# Patient Record
Sex: Female | Born: 1970 | Race: White | Hispanic: Yes | Marital: Married | Smoking: Never smoker
Health system: Southern US, Community
[De-identification: ages and names within clinical notes are randomized; demographics above are authoritative.]

## PROBLEM LIST (undated history)

## (undated) DIAGNOSIS — Z86718 Personal history of other venous thrombosis and embolism: Secondary | ICD-10-CM

## (undated) DIAGNOSIS — D6851 Activated protein C resistance: Secondary | ICD-10-CM

## (undated) DIAGNOSIS — D649 Anemia, unspecified: Secondary | ICD-10-CM

## (undated) HISTORY — DX: Anemia, unspecified: D64.9

## (undated) HISTORY — PX: TUBAL LIGATION: SHX77

## (undated) HISTORY — DX: Personal history of other venous thrombosis and embolism: Z86.718

## (undated) HISTORY — DX: Activated protein C resistance: D68.51

---

## 2002-12-02 ENCOUNTER — Encounter (HOSPITAL_COMMUNITY): Admission: RE | Admit: 2002-12-02 | Discharge: 2003-01-01 | Payer: Self-pay | Admitting: Oncology

## 2002-12-02 ENCOUNTER — Encounter: Admission: RE | Admit: 2002-12-02 | Discharge: 2002-12-02 | Payer: Self-pay | Admitting: Oncology

## 2003-02-02 ENCOUNTER — Encounter: Admission: RE | Admit: 2003-02-02 | Discharge: 2003-02-02 | Payer: Self-pay | Admitting: Oncology

## 2003-02-28 ENCOUNTER — Ambulatory Visit (HOSPITAL_COMMUNITY): Admission: AD | Admit: 2003-02-28 | Discharge: 2003-02-28 | Payer: Self-pay | Admitting: Obstetrics & Gynecology

## 2003-03-31 ENCOUNTER — Encounter: Admission: RE | Admit: 2003-03-31 | Discharge: 2003-03-31 | Payer: Self-pay | Admitting: Oncology

## 2003-06-09 ENCOUNTER — Encounter: Admission: RE | Admit: 2003-06-09 | Discharge: 2003-06-09 | Payer: Self-pay | Admitting: Oncology

## 2003-06-09 ENCOUNTER — Encounter (HOSPITAL_COMMUNITY): Admission: RE | Admit: 2003-06-09 | Discharge: 2003-07-09 | Payer: Self-pay | Admitting: Oncology

## 2003-06-13 ENCOUNTER — Encounter: Payer: Self-pay | Admitting: *Deleted

## 2003-06-13 ENCOUNTER — Inpatient Hospital Stay (HOSPITAL_COMMUNITY): Admission: AD | Admit: 2003-06-13 | Discharge: 2003-06-26 | Payer: Self-pay | Admitting: *Deleted

## 2003-06-13 ENCOUNTER — Encounter (HOSPITAL_COMMUNITY): Payer: Self-pay | Admitting: Oncology

## 2003-06-15 ENCOUNTER — Encounter: Payer: Self-pay | Admitting: Obstetrics & Gynecology

## 2003-06-19 ENCOUNTER — Encounter: Payer: Self-pay | Admitting: Obstetrics & Gynecology

## 2003-06-22 ENCOUNTER — Encounter: Payer: Self-pay | Admitting: *Deleted

## 2003-07-17 ENCOUNTER — Encounter: Admission: RE | Admit: 2003-07-17 | Discharge: 2003-07-17 | Payer: Self-pay | Admitting: Oncology

## 2003-09-18 ENCOUNTER — Encounter: Admission: RE | Admit: 2003-09-18 | Discharge: 2003-09-18 | Payer: Self-pay | Admitting: Oncology

## 2006-10-08 ENCOUNTER — Observation Stay (HOSPITAL_COMMUNITY): Admission: AD | Admit: 2006-10-08 | Discharge: 2006-10-08 | Payer: Self-pay | Admitting: Obstetrics and Gynecology

## 2006-12-05 ENCOUNTER — Inpatient Hospital Stay (HOSPITAL_COMMUNITY): Admission: AD | Admit: 2006-12-05 | Discharge: 2006-12-07 | Payer: Self-pay | Admitting: Obstetrics and Gynecology

## 2007-01-11 ENCOUNTER — Ambulatory Visit (HOSPITAL_COMMUNITY): Admission: RE | Admit: 2007-01-11 | Discharge: 2007-01-11 | Payer: Self-pay | Admitting: Obstetrics and Gynecology

## 2009-03-20 ENCOUNTER — Other Ambulatory Visit: Admission: RE | Admit: 2009-03-20 | Discharge: 2009-03-20 | Payer: Self-pay | Admitting: Unknown Physician Specialty

## 2009-03-20 ENCOUNTER — Encounter (INDEPENDENT_AMBULATORY_CARE_PROVIDER_SITE_OTHER): Payer: Self-pay | Admitting: Unknown Physician Specialty

## 2011-04-11 NOTE — H&P (Signed)
[name], [name]            ACCOUNT NO.:  [id number]   MEDICAL RECORD NO.:  [id number]          PATIENT TYPE:  AMB   LOCATION:  DAY                           FACILITY:  [facility]   PHYSICIAN:  Tilda Burrow, M.D. DATE OF BIRTH:  [birth year]   DATE OF ADMISSION:  DATE OF DISCHARGE:  LH                              HISTORY & PHYSICAL   ADMISSION DIAGNOSES:  Desire for elective permanent sterilization.   HISTORY OF PRESENT ILLNESS:  This 40 year old Hispanic female now  recently status post uncomplicated vaginal delivery for breech infant is  admitted for elective permanent sterilization. She has been seen in our  office for postpartum visit, confirmed once again her desire for  permanent sterilization. She signed sterilization consents in the past.  She is admitted for elective permanent sterilization. Postpartum course  has been uneventful. It appears that records reviewed show that she had  a history of factor V Leiden in fact being diagnosed as a factor V  Leiden heterozygote after a pregnancy when she had pelvic vein  thrombosis after a third delivery and thrombophlebitis felt to be  attributable to the factor V Leiden. She received Lovenox for 6 weeks  postpartum.   PAST MEDICAL HISTORY:  Otherwise uneventful.   PAST SURGICAL HISTORY:  Negative.   ALLERGIES:  None.   Married. Works at [employer]   GENERAL EXAM:  Shows a healthy appearing Hispanic female. Weight 173.  Pupils are equal, round, and reactive. Extraocular movements intact.  NECK:  Supple. Trachea midline.  CHEST:  Clear to auscultation.  ABDOMEN:  Nontender.  EXTERNAL GENITALIA:  Normal.  VAGINAL EXAM:  Shows normal exam.  EXTREMITIES:  Within normal limits.   PLAN:  LTS Falope rings January 04, 2007.      Tilda Burrow, M.D.  Electronically Signed    JVF/MEDQ  D:  01/08/2007  T:  01/09/2007  Job:  [id number]

## 2011-04-11 NOTE — Discharge Summary (Signed)
[name], [name]                        ACCOUNT NO.:  [id number]   MEDICAL RECORD NO.:  [id number]                   PATIENT TYPE:  OBV   LOCATION:  LDR3                                 FACILITY:  [facility]   PHYSICIAN:  Lazaro Arms, M.D.                DATE OF BIRTH:  December 10, 1970   DATE OF ADMISSION:  06/13/2003  DATE OF DISCHARGE:  06/13/2003                                 DISCHARGE SUMMARY   HISTORY OF PRESENT ILLNESS:  [name] is a 40 year old Hispanic female,  gravida 4, para 3, abortus 0, with an estimated date of delivery of  July 29, 2003, by last menstrual period, six-week ultrasound, and  confirmatory 20-week ultrasound, currently at 33-3/[redacted] weeks gestation.  The  patient's prenatal course has been unremarkable to this point.  She  presented this morning to the office complaining of spontaneous rupture of  membranes at 5 a.m.  On examination, that was clearly the case.  The patient  denies any labor and in fact visually the cervix appears to be long, thick,  and closed.  The patient was brought to [facility] where she  underwent a group B Streptococcus culture.  CBC and UA were collected.  She  was given ampicillin.  I did an ultrasound which revealed the fetus to be in  the vertex presentation.  There was a very small amount of fluid surrounding  the baby.  The baby has a reactive NST with no variable decelerations.  I  called Dr. Gavin Potters and spoke with him.  She is being transferred for  expectant management at this time secondary to the need for a neonatal  intensive care unit.   Interestingly, the patient has a history with her last delivery in 2001 of a  septic pelvic thrombophlebitis after a normal spontaneous vaginal delivery.  She was found subsequently to have a factor V Leiden mutation and is  heterozygous for that.  She has been followed by Dr. Mariel Sleet periodically  again since that time, who is our hematologist/oncologist here on staff.  I  talked with him on a couple of occasions and he did not recommend any  antepartum treatment with subcu heparin, but did recommend postpartum  treatment with 40 mg of Lovenox subcu daily for six weeks.  Dr. Gavin Potters is  made aware of that his notes will be forwarded with the patient regarding  that.   PAST MEDICAL HISTORY:  Heterozygous for factor V Leiden mutation.   PAST SURGICAL HISTORY:  Negative.   PAST OBSTETRICAL HISTORY:  As stated above plus two normal spontaneous  vaginal deliveries.  All three were at term.  One was on [place], one was  in [place], and the last one was at [facility].   ALLERGIES:  None.   MEDICATIONS:  Prenatal vitamins.   SOCIAL HISTORY:  The patient is married.  She is [place].  She speaks very  little  English.   REVIEW OF SYSTEMS:  Otherwise negative.   LABORATORY DATA:  Blood type is A positive.  The antibody screen is  negative.  Rubella is immune.  Hepatitis B was negative.  HIV was  nonreactive.  Serology was nonreactive.  Pap was negative.  GC and chlamydia  were both  negative.  AFP was normal.  Glucola was normal.  A group B  Streptococcus has not yet been drawn in the office, but was collected today  after rupture of membranes.  The patient did have one Enterococcus UTI  during the pregnancy, was treated, and a followup culture was negative.   PHYSICAL EXAMINATION:  HEENT:  Unremarkable.  NECK:  The thyroid is normal.  LUNGS:  Clear.  BREASTS:  Deferred.  ABDOMEN:  Gravid.  Fundal height of 33 cm.  PELVIC:  The cervix is not assessed.  Visually the cervix is long, thick,  and closed.  EXTREMITIES:  Warm with no edema.  NEUROLOGIC:  Exam is grossly intact.   IMPRESSION:  1. Intrauterine pregnancy at 33-1/[redacted] weeks gestation.  2. Premature rupture of membranes.  3. Factor V Leiden mutation, heterozygote, with history of a postpartum     septic pelvic thrombophlebitis.   PLAN:  The patient is being transferred to [facility]  [place]  under the care of Dr. Gavin Potters for continued antepartum, intrapartum, and  post delivery care.  The patient and the father of the baby understand the  implication for this and agree to the transfer.                                               Lazaro Arms, M.D.    Loraine Maple  D:  06/13/2003  T:  06/13/2003  Job:  [id number]

## 2011-04-11 NOTE — H&P (Signed)
[name], [name]            ACCOUNT NO.:  [id number]   MEDICAL RECORD NO.:  [id number]          PATIENT TYPE:  INP   LOCATION:  A401                          FACILITY:  [facility]   PHYSICIAN:  Lazaro Arms, M.D.   DATE OF BIRTH:  02/01/1971   DATE OF ADMISSION:  12/05/2006  DATE OF DISCHARGE:  LH                              HISTORY & PHYSICAL   [name] is a 40 year old Hispanic female gravida 5, para 4, abortus 0  with estimated date of delivery of December 16, 2006, confirmed by her  last menstrual period and an 8 week ultrasound, currently at 38-1/[redacted]  weeks gestation, who presented to Labor & Delivery complaining of  regular uterine contractions.  She has been having contractions every 2-  3 minutes, she was 4 cm at time of admission but quickly progressed to  complete while I was doing another delivery.  Fetal heart rate tracing  was reactive and stable, there was no rupture of membranes.  I came in  and assessed her and the fetal part was floating, there was a bulging  bag of water filling up the entire vagina.  I did a gentle amniotomy and  got a large amount of very particulate meconium stained amniotic fluid.  As the fetal presentation part presenting came down it was found to be a  frank breech.  With the patient having delivered four babies before, one  being 9 pounds and 5 ounces, having a __________ pelvis, being frank  breech, I decided to proceed with delivery of a frank breech infant.   PAST MEDICAL HISTORY:  Significant for a postpartum hypogastric iliac  vein thrombosis after her third delivery with subsequent septic pelvic  thrombophlebitis and she is found to be heterozygous for Factor V  Leiden.  She is going to have prophylaxis with Lovenox for the 6 weeks  postpartum.  She has been seen by Dr. Mariel Sleet in the past.   PAST SURGICAL HISTORY:  Negative.   PAST OB:  Four vaginal deliveries.  The last one was at [facility] in 2004  because of premature labor and  rupture of membranes at 33 weeks.   MEDICATIONS:  Prenatal vitamins.   REVIEW OF SYSTEMS:  Otherwise negative.  Her blood type is A positive,  her antibody screen is negative, rubella is immune, hepatitis B is  negative, HIV is nonreactive.  HSV 1 and 2 are negative.  GC and  Chlamydia are negative x2.  AFP was normal.  Group B strep was negative.  Pap was normal.  Her Glucola I do not see recorded.   REVIEW OF SYSTEMS:  Is otherwise negative.   IMPRESSION:  1. Intrauterine pregnancy at 38-1/[redacted] weeks gestation.  2. Multiparous female in the frank breech presentation.   PLAN:  Preparations were made to do an assisted frank breech vaginal  delivery.      Lazaro Arms, M.D.  Electronically Signed     LHE/MEDQ  D:  12/05/2006  T:  12/06/2006  Job:  [id number]

## 2011-04-11 NOTE — Op Note (Signed)
[name], KUPER            ACCOUNT NO.:  [id number]   MEDICAL RECORD NO.:  [id number]          PATIENT TYPE:  INP   LOCATION:  A401                          FACILITY:  [facility]   PHYSICIAN:  Lazaro Arms, M.D.   DATE OF BIRTH:  1971-06-02   DATE OF PROCEDURE:  12/05/2006  DATE OF DISCHARGE:                               OPERATIVE REPORT   Modene is a 40 year old gravida 5, para 4, at 38-1/[redacted] weeks gestation who  presented to Labor & Delivery a short time ago and was found to be in  the frank breech presentation after amniotomy.  She was complete and the  buttocks was found to be at +2 station.  As a result, it was decided  with her history of having had 4 vaginal deliveries, with one being 9  pounds and 5 ounces, to go ahead and do a vaginal breech delivery; the  patient is very cooperative and pelvis is more than adequate.  The  patient pushed with three contractions in a row and we delivered sacrum  anterior.  The patient then stopped pushing on command, I did flexion of  the knees and delivered both lower extremities.  She then pushed to the  level of the shoulders.  There was two nuchal arms, both of which were  delivered by flexion, then extension at the elbow without difficulty.  There was also a nuchal cord x1.  The body was then supported and a  flexion of the fetal vertex was performed.  The patient pushed and with  one push delivered the fetal vertex.  The infant was delivered at 1524  with Apgars of 7 and 9.  The fluid around the head was clear.  The  infant underwent positive pressure O2 because of being a breech delivery  and quickly responded after about 10 seconds of positive pressure O2.  There was 3 vessel cord, cord blood and cord gas sent.  The infant then  underwent routine neonatal resuscitation.  The placenta was delivered  spontaneously and intact and the uterus was firm below the umbilicus.  There was a second degree midline laceration which was resulted  and  repaired with #3-0 Monocryl after adequate numbing.  There was good  repair of anatomy.  Blood loss for delivery was 250 mL and the mother  will undergo routine postpartum care with prophylactic Lovenox because  of her history of Factor V Leiden and history of a deep vein thrombosis.      Lazaro Arms, M.D.  Electronically Signed     LHE/MEDQ  D:  12/05/2006  T:  12/06/2006  Job:  [id number]

## 2011-04-11 NOTE — H&P (Signed)
NAMEMERIT, GADSBY            ACCOUNT NO.:  [id number]   MEDICAL RECORD NO.:  [id number]          PATIENT TYPE:  OIB   LOCATION:  LDR5                          FACILITY:  [facility]   PHYSICIAN:  Lazaro Arms, M.D.   DATE OF BIRTH:  [birth year]   DATE OF ADMISSION:  10/08/2006  DATE OF DISCHARGE:  LH                                HISTORY & PHYSICAL   Foye is approximately [redacted] weeks pregnant with her fifth baby.  She does  have a history of a preterm delivery due to PPROM at 33-1/2 weeks.  Our  communication was a little bit difficult due to the fact that she does not  speak [place] and I do not speak [place].  However, she get the point across  to me that she thought that she was having contractions since this morning.  Her cervix is fingertip dilated at the inner os and it is firm.  It is long  with an outer os about 2 cm dilated.  She was sent to labor and delivery for  evaluation of contractions and she is having contractions on the monitor  about every 4 minutes.  Fetal heart rate is reactive without decelerations.  At this point, we will treat her with subcutaneous terbutaline and IV fluids  and check a CBC.  Her urine did have +2 ketones and some blood in it so we  will hopefully get her just well hydrated and stop her contractions.      Jacklyn Shell, C.N.M.      Lazaro Arms, M.D.  Electronically Signed    FC/MEDQ  D:  10/08/2006  T:  10/08/2006  Job:  [id number]

## 2011-04-11 NOTE — Op Note (Signed)
NAMEKINGSTON, [name]            ACCOUNT NO.:  [id number]   MEDICAL RECORD NO.:  [id number]          PATIENT TYPE:  AMB   LOCATION:  DAY                           FACILITY:  [facility]   PHYSICIAN:  Tilda Burrow, M.D. DATE OF BIRTH:  1971/10/22   DATE OF PROCEDURE:  01/11/2007  DATE OF DISCHARGE:                               OPERATIVE REPORT   PREOP:  1. Elective sterilization.  2. Factor V Leiden carrier status.   POSTOP:  Elective sterilization.   PROCEDURE:  Laparoscopic tubal sterilization Falope ring.   SURGEON:  Ferguson.   ASSISTANT:  Whitt, CST.   ANESTHESIA:  General.   COMPLICATIONS:  None.   FINDINGS:  Healthy tubes bilaterally, no evidence of intraabdominal  adhesions, see photos taken.   DETAILS OF PROCEDURE:  The patient was taken to the operating room,  prepped and draped in the usual standard fashion with legs in low  lithotomy leg supports after general anesthesia was introduced without  difficulty.  The bladder was in-and-out catheterized and Hulka tenaculum  attached to the cervix for uterine  manipulation.  An infraumbilical,  vertical, 1-cm, skin incision was made as well as a transverse  suprapubic 1-cm incision.  A Veress needle was used to achieve  pneumoperitoneum through the umbilical incision while being careful to  orient the needle toward the pelvis while elevating the abdominal wall  by manual elevation.  Water droplet test was used to confirm  intraperitoneal placement.   Pneumoperitoneum was achieved easily under 8-10 mm of intra-abdominal  pressure; and the laparoscopic trocar was introduced, a 5-mm blunt  tipped trocar, under direct visualization using the video camera.  Peritoneal cavity was entered without difficulty.  Inspection of the  anterior surfaces of the abdominal contents showed no evidence of injury  or bleeding.  Attention was directed to the pelvis.  Findings were as  described above.   Attention was first directed to  the left fallopian tube which was  elevated, identified to its fimbriated end and grasped in its mid  portion with Falope ring applier.  Falope ring applied and then the tube  infiltrated with Marcaine solution 0.25% using a 22-gauge spinal needle  percutaneously applied.   Attention was then directed to the right fallopian tube where a similar  procedure was performed.  Photo documentation of the ring placements was  performed; 120 mL of saline was instilled into the abdomen; deflation of  CO2 performed; instruments removed and subcuticular 4-0 Dexon closure of  skin incisions performed.  The rest of the surgical instruments were  removed; Steri-Strips placed.  The patient allowed to awaken and go to  recovery room in standard fashion.      Tilda Burrow, M.D.  Electronically Signed     JVF/MEDQ  D:  01/11/2007  T:  01/11/2007  Job:  [id number]

## 2011-04-11 NOTE — Discharge Summary (Signed)
[name], [name]                        ACCOUNT NO.:  [id number]   MEDICAL RECORD NO.:  [id number]                   PATIENT TYPE:  INP   LOCATION:  9146                                 FACILITY:  [facility]   PHYSICIAN:  Maurice March, M.D.                  DATE OF BIRTH:  Jan 01, 1971   DATE OF ADMISSION:  06/13/2003  DATE OF DISCHARGE:  06/26/2003                                 DISCHARGE SUMMARY   ADMISSION DIAGNOSES:  50. A 40 year old gravidaida 4, para 3-0-0-3 at 33-3/7 weeks.  2. Preterm rupture of membranes.  3. Factor V Leiden disease.  4. History of pelvic septic thrombophlebitis with __________.   DISCHARGE DIAGNOSES:  64. A 40 year old gravidaida 4, para 4, postpartum day number three status post     normal spontaneous vaginal delivery.  2. Factor V Leiden disease.  3. Urticaria.   DISCHARGE MEDICATIONS:  1. Ibuprofen 600 mg p.o. q.6h. p.r.n.  2. Coumadin 5 mg p.o. every day.  3. Lovenox 40 mg injections b.i.d. until Coumadin level is therapeutic.  4. Micronor one p.o. every day as directed.  5. Benadryl 50 mg q.6h. p.r.n.  6. Prenatal vitamin one p.o. every day.   ADMISSION HISTORY:  [name] is a 40 year old G4 G4, P3-0-0-3 who  presented at 33-3/7 weeks with spontaneous ruptured membranes.  She was  noted to have intermittent contractions on admission.  The fetal heart  tracing was reassuring on admission.  She was admitted for preterm labor.   HOSPITAL COURSE:  On admission, the patient was given betamethasone x2.  She  was started on Unison as well as IV heparin for the history of pelvic septic  thrombophlebitis.  Throughout the hospital stay the patient was noted to  continue leaking fluid.  Her AFI dropped from 11 to 9.  Fetal lung maturity  was tested and noted to be equivocal.  On hospital day number ten the  patient went into spontaneous labor and delivered a viable boy with Apgars 9  and 9.  Prior to delivery the patient had hyperglycemia likely secondary  to  betamethasone.  She was put on NPH insulin for approximately ten days until  she went into labor.   Factor V Leiden disease with history of septic thrombophlebitis.  The  patient was placed on heparin until delivery when she was changed over to  Lovenox and Coumadin.  On the day of discharge her Coumadin level was sub-  therapeutic with an INR of 1.0.  home health was arranged for her to receive  Lovenox injections at home until her Coumadin level is therapeutic.  Once  her Coumadin level is therapeutic she will need Coumadin for six weeks.  She  is to return to [facility] each week for an INR check.  She will need to have  primary care arranged.   The patient is breast and bottle feeding.  For contraception she is not able  to have an estrogen containing birth control pill and was placed on  Micronor.  After breast feeding she should continue estrogen absent OCPs due  to the risk of clotting.   Urticaria.  The patient complained of urticaria on the day of discharge.  She did have some small urticarial lesions over the superior aspect of her  buttocks as well as her arms.  She is given Benadryl which eased her  pruritus.   CONDITION ON DISCHARGE:  The patient was discharged to home in stable  condition.   DISCHARGE INSTRUCTIONS:  Instructions given to the patient:  1. Patient was told of her medical regimen.  2. She was told to return to the MAU in one week for an INR.  3. She was told to return to [facility] in six weeks for a postpartum     followup visit.                                                 Maurice March, M.D.    LC/MEDQ  D:  06/27/2003  T:  06/28/2003  Job:  [id number]

## 2013-08-30 ENCOUNTER — Other Ambulatory Visit (HOSPITAL_COMMUNITY): Payer: Self-pay | Admitting: Physician Assistant

## 2013-08-30 DIAGNOSIS — Z139 Encounter for screening, unspecified: Secondary | ICD-10-CM

## 2013-09-19 ENCOUNTER — Ambulatory Visit (HOSPITAL_COMMUNITY): Payer: Self-pay

## 2013-10-24 ENCOUNTER — Ambulatory Visit (HOSPITAL_COMMUNITY)
Admission: RE | Admit: 2013-10-24 | Discharge: 2013-10-24 | Disposition: A | Discharge status: Home / Self Care | Payer: Self-pay | Source: Ambulatory Visit | Attending: Physician Assistant | Admitting: Physician Assistant

## 2013-10-24 DIAGNOSIS — Z139 Encounter for screening, unspecified: Secondary | ICD-10-CM

## 2013-11-24 DIAGNOSIS — D6851 Activated protein C resistance: Secondary | ICD-10-CM

## 2013-11-24 HISTORY — DX: Activated protein C resistance: D68.51

## 2014-05-03 ENCOUNTER — Other Ambulatory Visit (HOSPITAL_COMMUNITY): Payer: Self-pay | Admitting: Physician Assistant

## 2014-05-03 ENCOUNTER — Emergency Department (HOSPITAL_COMMUNITY)
Admission: EM | Admit: 2014-05-03 | Discharge: 2014-05-03 | Disposition: A | Discharge status: Home / Self Care | Payer: Self-pay | Attending: Emergency Medicine | Admitting: Emergency Medicine

## 2014-05-03 ENCOUNTER — Encounter (HOSPITAL_COMMUNITY): Payer: Self-pay | Admitting: Emergency Medicine

## 2014-05-03 ENCOUNTER — Ambulatory Visit (HOSPITAL_COMMUNITY)
Admission: RE | Admit: 2014-05-03 | Discharge: 2014-05-03 | Disposition: A | Discharge status: Home / Self Care | Payer: Self-pay | Source: Ambulatory Visit | Attending: Physician Assistant | Admitting: Physician Assistant

## 2014-05-03 DIAGNOSIS — I82409 Acute embolism and thrombosis of unspecified deep veins of unspecified lower extremity: Secondary | ICD-10-CM

## 2014-05-03 DIAGNOSIS — R609 Edema, unspecified: Secondary | ICD-10-CM

## 2014-05-03 DIAGNOSIS — R52 Pain, unspecified: Secondary | ICD-10-CM

## 2014-05-03 DIAGNOSIS — I824Y9 Acute embolism and thrombosis of unspecified deep veins of unspecified proximal lower extremity: Secondary | ICD-10-CM | POA: Insufficient documentation

## 2014-05-03 LAB — BASIC METABOLIC PANEL
BUN: 15 mg/dL (ref 6–23)
CO2: 25 mEq/L (ref 19–32)
Calcium: 9.2 mg/dL (ref 8.4–10.5)
Chloride: 103 mEq/L (ref 96–112)
Creatinine, Ser: 0.78 mg/dL (ref 0.50–1.10)
GFR calc Af Amer: >90 mL/min (ref >90)
Glucose, Bld: 91 mg/dL (ref 70–99)
POTASSIUM: 4.2 meq/L (ref 3.7–5.3)
SODIUM: 141 meq/L (ref 137–147)

## 2014-05-03 LAB — CBC WITH DIFFERENTIAL/PLATELET
BASOS PCT: 1 % (ref 0–1)
Basophils Absolute: 0 10*3/uL (ref 0.0–0.1)
Eosinophils Absolute: 0.2 10*3/uL (ref 0.0–0.7)
Eosinophils Relative: 3 % (ref 0–5)
HCT: 34.9 % — ABNORMAL LOW (ref 36.0–46.0)
Hemoglobin: 11.8 g/dL — ABNORMAL LOW (ref 12.0–15.0)
LYMPHS ABS: 2.6 10*3/uL (ref 0.7–4.0)
LYMPHS PCT: 37 % (ref 12–46)
MCH: 30.7 pg (ref 26.0–34.0)
MCHC: 33.8 g/dL (ref 30.0–36.0)
MCV: 90.9 fL (ref 78.0–100.0)
MONOS PCT: 6 % (ref 3–12)
Monocytes Absolute: 0.5 10*3/uL (ref 0.1–1.0)
NEUTROS ABS: 3.7 10*3/uL (ref 1.7–7.7)
NEUTROS PCT: 53 % (ref 43–77)
Platelets: 406 10*3/uL — ABNORMAL HIGH (ref 150–400)
RBC: 3.84 MIL/uL — AB (ref 3.87–5.11)
RDW: 13 % (ref 11.5–15.5)
WBC: 7.1 10*3/uL (ref 4.0–10.5)

## 2014-05-03 MED ORDER — XARELTO VTE STARTER PACK 15 & 20 MG PO TBPK: 15.0000 mg | ORAL_TABLET | ORAL | Status: DC

## 2014-05-03 NOTE — ED Notes (Signed)
Pt had US done today to left leg and resulted positive

## 2014-05-03 NOTE — Discharge Instructions (Signed)
Trombosis venosa profunda (Deep Vein Thrombosis) La trombosis venosa profunda (TVP) es la formacin de un cogulo de sangre en las venas profundas y ms grandes de la pierna, el brazo o la pelvis. Esta trombosis es ms peligrosa que los cogulos que se forman en las venas ubicadas cerca de la superficie del cuerpo. Puede causar complicaciones si el cogulo se desprende y viaja a travs de la Runner, broadcasting/film/videosangre hasta llegar a los pulmones.  La TVP puede daar las vlvulas de las venas de las piernas, de manera que el flujo sanguneo, Teacher, English as a foreign languageen lugar de ir hacia arriba, se acumula en la parte inferior de la pierna. A este trastorno se lo llama sndrome postrombtico, y Patent examinerpuede producir dolor, hinchazn, decoloracin y lceras en la pierna. CAUSAS Generalmente varios son los motivos que contribuyen a la formacin de cogulos de [place]angre. Entre los factores que favorecen la formacin de cogulos son:  El flujo sanguneo es lento.  Interior de la vena daado por algn motivo.  Tiene algn padecimiento que favorece la formacin de cogulos de [place]angre. FACTORES DE RIESGO Algunas personas tienen ms facilidad que otras para formar cogulos sanguneos. Los factores de riesgo son:   La edad avanzada, especialmente despus de los 75aos de edad.  Tener antecedentes familiares de cogulos de sangre o si ya ha tenido un cogulo de [place]re.  Que le hayan practicado una ciruga mayor o de duracin prolongada. Esto es an ms frecuente en cirugas de cadera, rodilla o estmago (abdomen). La ciruga de cadera es especialmente riesgosa.  Fractura de cadera o de pierna.  Permanecer sentado o acostado durante un tiempo prolongado. Aqu se incluyen un viaje de larga distancia, casos de parlisis o en recuperacin de una enfermedad o ciruga.  Cncer o tratamiento para el cncer.  Tener colocado un tubo fino y Equities traderlargo (catter) dentro de una vena durante un procedimiento mdico.  Wilburt Finlayener sobrepeso (ser obeso).  Embarazo y  [place].  Los cambios hormonales durante el embarazo facilitan la formacin de cogulos.  El feto hace presin sobre las venas de la pelvis.  Tambin hay riesgo de lesin de las venas durante el parto o la cesrea. El riesgo es mayor inmediatamente despus del [place] Parkparto.  Los medicamentos que contienen estrgenos. Estos incluyen las pldoras anticonceptivas y la terapia de reemplazo hormonal.  Fumar.  Otras afecciones circulatorias o cardacas.  SIGNOS Y SNTOMAS Cuando se forma un cogulo, puede obstruir en forma parcial o total el flujo sanguneo en la vena afectada. Los sntomas de TVP pueden ser:  Hinchazn de la pierna o del brazo, especialmente notorio de un solo lado.  Enrojecimiento y calor de la pierna o del brazo, especialmente de un slo lado.  Dolor en un brazo o en una pierna. Si el cogulo est ubicado en la pierna, los sntomas pueden ser ms notorios y Theme park managerempeorar al pararse o Advertising account plannercaminar. Los sntomas de TVP, cuando ha migrado a los pulmones, (embolia de [place] Rockpulmn, [place]) y Scientist, research (physical sciences)generalmente, comienzan sbitamente y pueden ser:  Harrel LemonFalta de aire.  Tos.  Toser y Social research officer, governmentescupir sangre o flema con Retail buyersangre.  Dolor en el pecho. Dolor que empeora al hacer inspiraciones profundas.  Latidos cardacos rpidos. Teresita Maduraoda persona con estos sntomas debe buscar rpidamente tratamiento mdico de Associate Professoremergencia. Comunquese con el servicio de emergencias de su localidad (911 en los [place]) si usted tiene estos sntomas. DIAGNSTICO Si se sospecha una TVP, su mdico le har una historia clnica completa y un examen fsico. Podrn solicitarle otros estudios, por ejemplo:  Anlisis de [place]burgsangre, incluyendo estudios  de coagulacin.  Ultrasonografa para evaluar si tiene cogulos en sus piernas o pulmones.  Estudios radiolgicos con inyeccin de contraste intravenoso para ver el flujo sanguneo (venografa).  Estudios de los pulmones, si tiene sntomas respiratorios. PREVENCIN  Ejercite las piernas con  regularidad. Camine media [employer].  Mantenga un peso apropiado para su altura.  Evite permanecer sentado o parado sin moverse durante perodos prolongados.  Las mujeres, especialmente las que tienen ms de 36aos, deben [employer] y beneficios de tomar medicamentos que contengan estrgeno, incluidas las pldoras anticonceptivas.  No fume, especialmente si toma estrgenos.  Los viajes de larga distancia pueden aumentar el riesgo de TVP. Ejercite las piernas caminando o moviendo los msculos una vez por hora.  Prevencin intrahospitalaria:  Muchos de los factores de riesgo anteriormente mencionados se refieren a situaciones que ocurren con la hospitalizacin, ya sea por enfermedad, lesin o Civil Service fast streamer.  Su mdico va a evaluar la necesidad de profilaxis del tromboembolismo venoso cuando se lo admita en el hospital. Si debe someterse a [place], su cirujano lo evaluar el mismo da o el da despus de la [place].  La prevencin puede ser con o sin medicamentos. TRATAMIENTO Si se diagnostica una TVP, debe ser tratada. Tambin en algunas circunstancias, puede prevenirse. Si ya ha tenido una TVP, aumenta el riesgo de volver a Teacher, music futuro . El tratamiento ms frecuente para la TVP se realiza con medicamentos anticoagulantes que reducen la tendencia de la sangre a coagularse. Los anticoagulantes pueden impedir la formacin de nuevos cogulos de sangre o que los ya existentes aumenten de Recruitment consultant. No pueden disolver los cogulos ya formados. El organismo lo hace por s mismo con Museum/gallery conservator. Los anticoagulantes se pueden administrar por va oral, por va intravenosa (IV) o inyectable. Su mdico determinar la mejor forma para usted. Estos son otros medicamentos o tratamiento que pueden utilizarse:  La heparina o los medicamentos relacionados (heparina de bajo peso molecular) son por lo general Financial risk analyst tratamiento para un cogulo de [place]. Actan rpidamente. Sin  embargo, no pueden tomarse por va oral.  La heparina disminuye la cantidad de un componente de la sangre cuya funcin es impedir las hemorragias y forma cogulos sanguneos (plaquetas). Ser controlado mediante anlisis de sangre para asegurarse de que esto no [place].  La warfarina es un anticoagulante que puede ingerirse. Demora unos [employer] a Scientist, water quality, por lo tanto se [place] en combinacin con heparina o medicamentos relacionados. Una vez que la warfarina comienza a actuar, la heparina se suspende.  Con menor frecuencia, se utilizan medicamentos que disuelven cogulos (trombolticos) para Restaurant manager, fast food una TVP. Como implican un mayor riesgo de [place], se utilizan principalmente en los casos ms graves, cuando la vida o la integridad fsica se ve Administrator.  En contadas ocasiones, un cogulo de sangre en la pierna debe eliminarse quirrgicamente.  Si usted no puede Solicitor, su mdico podra indicar la colocacin de un filtro en la vena principal del abdomen. Los filtros impiden que los cogulos lleguen a los pulmones. INSTRUCCIONES PARA EL CUIDADO EN EL HOGAR  Tome todos los medicamentos como le indic el mdico. Utilice los medicamentos de venta libre o recetados para Primary school teacher, el malestar o la fiebre, segn se lo indique el mdico.  La [place]. La [employer]son de las personas deber seguir tomando warfarina despus del alta hospitalaria. Su mdico le indicar la duracin del tratamiento (generalmente de 3 a y a Occupational psychologist toda la vida).  Las dosis  altas y bajas de warfarina son peligrosas. El exceso de warfarina aumenta el riesgo de [place]. Dosis demasiado bajas de warfarina aumentan el riesgo de formacin de cogulos. Durante el tratamiento con warfarina, es necesario que se realicen peridicamente anlisis de sangre que miden el tiempo de Biomedical scientist. Estos anlisis de sangre suelen incluir tanto el tiempo de protrombina (PT) como el ndice internacional  normalizado (INR). Los resultados del PT y del INR permiten al mdico ajustar la dosis de warfarina. La dosis puede cambiarse por varias razones. Es de suma importancia que tome la warfarina exactamente como se le prescribi, y que tenga los niveles de PT y de INR exactamente como se le indic.  Muchos de los alimentos, especialmente los que contienen vitamina K pueden interferir con la warfarina y Audiological scientist los [place] de PT y del INR. Los alimentos ricos en vitamina K son la espinaca, la col rizada, el brcoli, el repollo, la col y [address], repollitos de [place], guisantes, Counsellor, [employer] y [place], y tambin la carne de hgado de cerdo y [place], el t verde y el aceite de soja. Debe consumir siempre la misma cantidad de alimentos ricos en vitamina K. Evite los [employer] en su dieta, o informe a su mdico antes de cambiar su dieta. Solicite una cita con un nutricionista para hacerle las preguntas que le surjan.  Muchos medicamentos interfieren con la warfarina y [employer]Ex del PT y del INR. Debe informarle a su mdico acerca de todos los medicamentos que tome. Esto incluye todas las vitaminas y suplementos. Tenga especial cuidado con la aspirina y los medicamentos antiinflamatorios. Consulte a su mdico antes de tomarlos. No tome ni suspenda ningn medicamento tanto recetado como de venta libre, a menos que se lo haya indicado su mdico o Social research officer, government.  La warfarina puede tener efectos secundarios, principalmente aumento de hematomas o sangrado. Si se lastima, deber ejercer presin sobre las heridas por ms tiempo que lo habitual. Su mdico o su Administrator, sports comentarn otros posibles efectos secundarios.  El alcohol puede modificar la capacidad del organismo de metabolizar la warfarina. Es preferible evitar tomar bebidas alcohlicas o consumir solo muy pequeas cantidades cuando se est en tratamiento con warfarina. Hgale saber a su mdico si modifica su consumo de  alcohol.  Informe a su dentista o a otros profesionales antes de que le efecten cualquier procedimiento.  Actividad. Consulte con su mdico cundo podr volver a sus actividades normales. Es importante mantenerse activo para prevenir los cogulos sanguneos. Si est tomando un medicamento anticoagulante, evite los deportes de contacto.  Haga actividad fsica. Es Barrister's clerk. Esto es especialmente importante durante viajes, o al estar sentado o de pie durante largos perodos. Ejercite las piernas caminando o bombeando los msculos con frecuencia. Camine con frecuencia.  Medias de compresin. Son medias elsticas apretadas que aplican presin a las piernas. Esta presin ayuda a Programmer, systems en las piernas se coagule. Es posible que deba usar medias de compresin en su casa para prevenir una TVP.  No fume. Si fuma, abandone el hbito. Pdale ayuda al mdico, si es necesario.  Aprenda tanto como pueda sobre la TVP. Conocer ms sobre la enfermedad lo ayudar a Careers adviser las medidas necesarias para evitar que vuelva.  Utilice un brazalete o lleve consigo una tarjeta de alerta mdico. SOLICITE ATENCIN MDICA SI:  Nota que el ritmo de sus latidos cardacos est acelerado.  Se siente ms dbil o ms cansado que de costumbre.  Siente que va  a desmayarse.  Observa un aumento de los hematomas.  Siente que los sntomas no mejoran en el tiempo esperado.  Cree que tiene efectos secundarios por el medicamento. SOLICITE ATENCIN MDICA DE INMEDIATO SI:  Siente dolor en el pecho.  Tiene dificultad para respirar.  Aumenta la hinchazn o el dolor en una pierna.  Tose y escupe sangre.  Nota sangre en el vmito, en las heces o en la orina. ASEGRESE DE QUE:  Comprende estas instrucciones.  Controlar su afeccin.  Recibir ayuda de inmediato si no mejora o si empeora. Document Released: 02/17/2008 Document Revised: 08/31/2013 [facility] Patient Information 2014  [place], [place].

## 2014-05-03 NOTE — ED Provider Notes (Signed)
CSN: [id number]     Arrival date & time 05/03/14  1401 History   First MD Initiated Contact with Patient 05/03/14 1559     Chief Complaint  Patient presents with  . DVT     (Consider location/radiation/quality/duration/timing/severity/associated sxs/prior Treatment) Patient is a 43 y.o. female presenting with leg pain. The history is provided by the patient. No language interpreter was used.  Leg Pain Location:  Leg Leg location:  L leg Pain details:    Quality:  Aching   Severity:  Moderate   Progression:  Worsening Associated symptoms: no fever   Associated symptoms comment:  The patient came here for a lower extremity doppler ordered outpatient by her doctor to evaluation symptoms of left lower extremity pain and swelling. She reports history of DVT 14 years ago. No SOB, or chest pain.   History reviewed. No pertinent past medical history. History reviewed. No pertinent past surgical history. History reviewed. No pertinent family history. History  Substance Use Topics  . Smoking status: Never Smoker   . Smokeless tobacco: Not on file  . Alcohol Use: No   OB History   Grav Para Term Preterm Abortions TAB SAB Ect Mult Living                 Review of Systems  Constitutional: Negative for fever.  Respiratory: Negative for shortness of breath.   Cardiovascular: Negative for chest pain.  Musculoskeletal:       See HPI.  Neurological: Negative for numbness.      Allergies  Review of patient's allergies indicates no known allergies.  Home Medications   Prior to Admission medications   Not on File   BP 129/79  Pulse 80  Temp(Src) 98.2 F (36.8 C) (Oral)  Resp 20  SpO2 100%  LMP 04/24/2014 Physical Exam  Constitutional: She is oriented to person, place, and time. She appears well-developed and well-nourished. No distress.  Musculoskeletal:  Swelling to left foot without discoloration, redness or warmth. Non-tender foot. Calf and posterior thigh mildly  tender. No swelling of calf or thigh.  Neurological: She is alert and oriented to person, place, and time.  Skin: Skin is warm and dry.  Psychiatric: She has a normal mood and affect.    ED Course  Procedures (including critical care time) Labs Review Labs Reviewed  CBC WITH DIFFERENTIAL - Abnormal; Notable for the following:    RBC 3.84 (*)    Hemoglobin 11.8 (*)    HCT 34.9 (*)    Platelets 406 (*)    All other components within normal limits  BASIC METABOLIC PANEL    Imaging Review US Venous Img Lower Unilateral Left  05/03/2014   CLINICAL DATA:  Left leg pain and swelling  EXAM: Left LOWER EXTREMITY VENOUS DOPPLER ULTRASOUND  TECHNIQUE: Gray-scale sonography with graded compression, as well as color Doppler and duplex ultrasound were performed to evaluate the lower extremity deep venous systems from the level of the common femoral vein and including the common femoral, femoral, profunda femoral, popliteal and calf veins including the posterior tibial, peroneal and gastrocnemius veins when visible. The superficial great saphenous vein was also interrogated. Spectral Doppler was utilized to evaluate flow at rest and with distal augmentation maneuvers in the common femoral, femoral and popliteal veins.  COMPARISON:  None.  FINDINGS: Common Femoral Vein: Nonocclusive thrombus is noted  Saphenofemoral Junction: No evidence of thrombus. Normal compressibility and flow on color Doppler imaging.  Profunda Femoral Vein: No evidence of thrombus. Normal compressibility  and flow on color Doppler imaging.  Femoral Vein: Nonocclusive thrombus is noted centrally. Peripherally of the thrombus is more occlusive with decreased flow and lack of compressibility.  Popliteal Vein: No evidence of thrombus. Normal compressibility, respiratory phasicity and response to augmentation.  Calf Veins: Decreased flow is noted in the peroneal and anterior tibial vein suggestive of thrombus  Superficial Great Saphenous Vein:  No evidence of thrombus. Normal compressibility and flow on color Doppler imaging.  Venous Reflux:  None.  Other Findings:  None.  IMPRESSION: Multifocal deep venous thrombosis.   Electronically Signed   By: Alcide CleverMark  Lukens M.D.   On: 05/03/2014 13:43     EKG Interpretation None      MDM   Final diagnoses:  None    1. DVT, left LE  Doppler study done this afternoon reviewed and is positive for nonobstructive femoral vein thrombus and calf vein thrombi. No symptoms suggestive of PE. Xarelto provided through pharmacy on pharmaceutical company coupon/program. All aspects of treatment plan discussed with patient via interpreter.     Arnoldo HookerShari A Teauna Dubach, PA-C 05/03/14 1626

## 2014-05-03 NOTE — ED Provider Notes (Signed)
Patient seen/examined in the Emergency Department in conjunction with Midlevel Provider [place] Patient reports left Le pain.  She has DVT Exam : awake/alert, left LE edema noted.   Plan: will start on xarelto, phamacy and case management to see to assist with starting medications   Joya Gaskins, MD 05/03/14 1626

## 2014-05-03 NOTE — ED Provider Notes (Signed)
Medical screening examination/treatment/procedure(s) were conducted as a shared visit with non-physician practitioner(s) and myself.  I personally evaluated the patient during the encounter.   EKG Interpretation None        Joya Gaskins, MD 05/03/14 2156

## 2014-05-03 NOTE — Care Management Note (Signed)
Pt in ED with new DVT found on U/S today. Sent by [facility], due to pain in leg.  Spoke with patient and daughter in the room. Pt does not speak English well.  Is from [place] and speaks Spanish ,  But daughter is fluent in both, as she was born here. Pt does not have insurance. She was given the starter pack for Xeralto, along with Rx, and the application for the [employer] free medication program. She has an appointment at the [facility] tomorrow to follow- up after this visit and was instructed to fill out her part of the app., and take with her to the appointment for the clinic MD/ PA to finish and fax in for her medication for the next months up to 1 year.  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 Translator phone used to  Talk with pt and daughter  Assisting with explaining things she does not understand. Message left at free Clinic concerning pt visit tomorrow, and paper work given to be filled out.

## 2014-05-04 ENCOUNTER — Encounter (HOSPITAL_COMMUNITY): Payer: Self-pay | Admitting: Lab

## 2014-05-23 ENCOUNTER — Encounter (HOSPITAL_COMMUNITY): Payer: Self-pay

## 2014-05-23 ENCOUNTER — Encounter (HOSPITAL_COMMUNITY): Payer: Self-pay | Attending: Hematology and Oncology

## 2014-05-23 VITALS — BP 113/77 | HR 65 | Temp 98.2°F | Resp 16 | Wt 178.2 lb

## 2014-05-23 DIAGNOSIS — I82412 Acute embolism and thrombosis of left femoral vein: Secondary | ICD-10-CM

## 2014-05-23 DIAGNOSIS — Z86718 Personal history of other venous thrombosis and embolism: Secondary | ICD-10-CM | POA: Insufficient documentation

## 2014-05-23 DIAGNOSIS — I82819 Embolism and thrombosis of superficial veins of unspecified lower extremities: Secondary | ICD-10-CM

## 2014-05-23 DIAGNOSIS — Z7901 Long term (current) use of anticoagulants: Secondary | ICD-10-CM | POA: Insufficient documentation

## 2014-05-23 DIAGNOSIS — D649 Anemia, unspecified: Secondary | ICD-10-CM | POA: Insufficient documentation

## 2014-05-23 DIAGNOSIS — I824Y9 Acute embolism and thrombosis of unspecified deep veins of unspecified proximal lower extremity: Secondary | ICD-10-CM | POA: Insufficient documentation

## 2014-05-23 LAB — CBC WITH DIFFERENTIAL/PLATELET
Basophils Absolute: 0 10*3/uL (ref 0.0–0.1)
Basophils Relative: 0 % (ref 0–1)
Eosinophils Absolute: 0.3 10*3/uL (ref 0.0–0.7)
Eosinophils Relative: 5 % (ref 0–5)
HEMATOCRIT: 35.1 % — AB (ref 36.0–46.0)
HEMOGLOBIN: 11.8 g/dL — AB (ref 12.0–15.0)
LYMPHS ABS: 2.2 10*3/uL (ref 0.7–4.0)
LYMPHS PCT: 40 % (ref 12–46)
MCH: 30.5 pg (ref 26.0–34.0)
MCHC: 33.6 g/dL (ref 30.0–36.0)
MCV: 90.7 fL (ref 78.0–100.0)
MONO ABS: 0.4 10*3/uL (ref 0.1–1.0)
MONOS PCT: 7 % (ref 3–12)
NEUTROS ABS: 2.6 10*3/uL (ref 1.7–7.7)
NEUTROS PCT: 48 % (ref 43–77)
Platelets: 307 10*3/uL (ref 150–400)
RBC: 3.87 MIL/uL (ref 3.87–5.11)
RDW: 13.3 % (ref 11.5–15.5)
WBC: 5.5 10*3/uL (ref 4.0–10.5)

## 2014-05-23 LAB — COMPREHENSIVE METABOLIC PANEL
ALK PHOS: 53 U/L (ref 39–117)
ALT: 16 U/L (ref 0–35)
AST: 18 U/L (ref 0–37)
Albumin: 3.8 g/dL (ref 3.5–5.2)
BUN: 17 mg/dL (ref 6–23)
CO2: 24 mEq/L (ref 19–32)
Calcium: 9.4 mg/dL (ref 8.4–10.5)
Chloride: 103 mEq/L (ref 96–112)
Creatinine, Ser: 0.81 mg/dL (ref 0.50–1.10)
GFR calc Af Amer: >90 mL/min (ref >90)
GFR, EST NON AFRICAN AMERICAN: 89 mL/min — AB (ref >90)
GLUCOSE: 94 mg/dL (ref 70–99)
POTASSIUM: 3.8 meq/L (ref 3.7–5.3)
SODIUM: 140 meq/L (ref 137–147)
Total Bilirubin: 0.2 mg/dL — ABNORMAL LOW (ref 0.3–1.2)
Total Protein: 7.6 g/dL (ref 6.0–8.3)

## 2014-05-23 LAB — LACTATE DEHYDROGENASE: LDH: 204 U/L (ref 94–250)

## 2014-05-23 LAB — D-DIMER, QUANTITATIVE: D-Dimer, Quant: 0.57 ug/mL-FEU — ABNORMAL HIGH (ref 0.00–0.48)

## 2014-05-23 MED ORDER — HYDROCODONE-ACETAMINOPHEN 5-325 MG PO TABS: 1.0000 | ORAL_TABLET | Freq: Four times a day (QID) | ORAL | Status: DC | PRN

## 2014-05-23 NOTE — Progress Notes (Signed)
[place] A. Barnet Glasgow, M.D.  NEW PATIENT EVALUATION   Name: Clemie General Date: 05/23/2014 MRN: [id number] DOB: 06-04-1971  PCP: Jacqualine Mau, PA-C   REFERRING PHYSICIAN: Jacqualine Mau, PA-C  REASON FOR REFERRAL: Left femoral near occlusive deep venous thrombosis.     HISTORY OF PRESENT ILLNESS:[name] is a 43 y.o. female who is referred for evaluation of possible thrombophilia with left lower extremity nearly occlusive femoral vein thrombosis with previous thrombosis 14 years ago. She is gravida 5 para 5 AB 0 with her menstrual period beginning today and lasting usually 4 days. She still has pain in the left mid thigh region without significant swelling. She denies any chest pain, PND, orthopnea, or palpitations. Previous blood clot within her pelvis and she recalls taking blood thinners for about 2-3 months only. She lives at home and cares for children. She denies any headache, melena, hematochezia, epistaxis, hematuria, skin rash, joint pain, headache, or seizures.   PAST MEDICAL HISTORY:  has a past medical history of History of blood clots.     PAST SURGICAL HISTORY:History reviewed. No pertinent past surgical history.   CURRENT MEDICATIONS: has a current medication list which includes the following prescription(s): acetaminophen, hydrocodone-acetaminophen, and xarelto starter pack.   ALLERGIES: Review of patient's allergies indicates no known allergies.   SOCIAL HISTORY:  reports that she has never smoked. She does not have any smokeless tobacco history on file. She reports that she does not drink alcohol or use illicit drugs.   FAMILY HISTORY: family history is not on file.    REVIEW OF SYSTEMS:  Other than that discussed above is noncontributory.    PHYSICAL EXAM:  weight is 178 lb 3.2 oz (80.831 kg). Her temperature is 98.2 F (36.8 C). Her blood pressure is 113/77 and her pulse is 65. Her  respiration is 16.    GENERAL:alert, no distress and comfortable SKIN: skin color, texture, turgor are normal, no rashes or significant lesions EYES: normal, Conjunctiva are pink and non-injected, sclera clear OROPHARYNX:no exudate, no erythema and lips, buccal mucosa, and tongue normal  NECK: supple, thyroid normal size, non-tender, without nodularity CHEST: Normal AP diameter with no breast masses. LYMPH:  no palpable lymphadenopathy in the cervical, axillary or inguinal LUNGS: clear to auscultation and percussion with normal breathing effort HEART: regular rate & rhythm and no murmurs. P2 is tolerable at the apex. ABDOMEN:abdomen soft, non-tender and normal bowel sounds MUSCULOSKELETALl:no cyanosis of digits, no clubbing or edema. Minimal tenderness in the mid thigh region with no significant swelling. Homans sign is negative.  NEURO: alert & oriented x 3 with fluent speech, no focal motor/sensory deficits    LABORATORY DATA:  Admission on 05/03/2014, Discharged on 05/03/2014  Component Date Value Ref Range Status  . Sodium 05/03/2014 141  137 - 147 mEq/L Final  . Potassium 05/03/2014 4.2  3.7 - 5.3 mEq/L Final  . Chloride 05/03/2014 103  96 - 112 mEq/L Final  . CO2 05/03/2014 25  19 - 32 mEq/L Final  . Glucose, Bld 05/03/2014 91  70 - 99 mg/dL Final  . BUN 05/03/2014 15  6 - 23 mg/dL Final  . Creatinine, Ser 05/03/2014 0.78  0.50 - 1.10 mg/dL Final  . Calcium 05/03/2014 9.2  8.4 - 10.5 mg/dL Final  . GFR calc non Af Amer 05/03/2014 >90  >90 mL/min Final  . GFR calc Af Amer 05/03/2014 >90  >90 mL/min Final  Comment: (NOTE)                          The eGFR has been calculated using the CKD EPI equation.                          This calculation has not been validated in all clinical situations.                          eGFR's persistently <90 mL/min signify possible Chronic Kidney                          Disease.  . WBC 05/03/2014 7.1  4.0 - 10.5 K/uL Final  . RBC 05/03/2014  3.84* 3.87 - 5.11 MIL/uL Final  . Hemoglobin 05/03/2014 11.8* 12.0 - 15.0 g/dL Final  . HCT 05/03/2014 34.9* 36.0 - 46.0 % Final  . MCV 05/03/2014 90.9  78.0 - 100.0 fL Final  . MCH 05/03/2014 30.7  26.0 - 34.0 pg Final  . MCHC 05/03/2014 33.8  30.0 - 36.0 g/dL Final  . RDW 05/03/2014 13.0  11.5 - 15.5 % Final  . Platelets 05/03/2014 406* 150 - 400 K/uL Final  . Neutrophils Relative % 05/03/2014 53  43 - 77 % Final  . Neutro Abs 05/03/2014 3.7  1.7 - 7.7 K/uL Final  . Lymphocytes Relative 05/03/2014 37  12 - 46 % Final  . Lymphs Abs 05/03/2014 2.6  0.7 - 4.0 K/uL Final  . Monocytes Relative 05/03/2014 6  3 - 12 % Final  . Monocytes Absolute 05/03/2014 0.5  0.1 - 1.0 K/uL Final  . Eosinophils Relative 05/03/2014 3  0 - 5 % Final  . Eosinophils Absolute 05/03/2014 0.2  0.0 - 0.7 K/uL Final  . Basophils Relative 05/03/2014 1  0 - 1 % Final  . Basophils Absolute 05/03/2014 0.0  0.0 - 0.1 K/uL Final    Urinalysis No results found for this basename: colorurine,  appearanceur,  labspec,  phurine,  glucoseu,  hgbur,  bilirubinur,  ketonesur,  proteinur,  urobilinogen,  nitrite,  leukocytesur      _0 : US Venous Img Lower Unilateral Left  05/03/2014   CLINICAL DATA:  Left leg pain and swelling  EXAM: Left LOWER EXTREMITY VENOUS DOPPLER ULTRASOUND  TECHNIQUE: Gray-scale sonography with graded compression, as well as color Doppler and duplex ultrasound were performed to evaluate the lower extremity deep venous systems from the level of the common femoral vein and including the common femoral, femoral, profunda femoral, popliteal and calf veins including the posterior tibial, peroneal and gastrocnemius veins when visible. The superficial great saphenous vein was also interrogated. Spectral Doppler was utilized to evaluate flow at rest and with distal augmentation maneuvers in the common femoral, femoral and popliteal veins.  COMPARISON:  None.  FINDINGS: Common Femoral Vein: Nonocclusive  thrombus is noted  Saphenofemoral Junction: No evidence of thrombus. Normal compressibility and flow on color Doppler imaging.  Profunda Femoral Vein: No evidence of thrombus. Normal compressibility and flow on color Doppler imaging.  Femoral Vein: Nonocclusive thrombus is noted centrally. Peripherally of the thrombus is more occlusive with decreased flow and lack of compressibility.  Popliteal Vein: No evidence of thrombus. Normal compressibility, respiratory phasicity and response to augmentation.  Calf Veins: Decreased flow is noted in the peroneal and anterior tibial vein suggestive of thrombus  Superficial Doristine Devoid  Saphenous Vein: No evidence of thrombus. Normal compressibility and flow on color Doppler imaging.  Venous Reflux:  None.  Other Findings:  None.  IMPRESSION: Multifocal deep venous thrombosis.   Electronically Signed   By: Inez Catalina M.D.   On: 05/03/2014 13:43    PATHOLOGY: No new pathology.   IMPRESSION:  #1. Multifocal deep venous thromboses left lower extremity. #2. History of previous pelvic thrombosis.   PLAN:  #1. Continue Xarelto 20 mg daily for at least 6 months. #2. Hydrocodone 5/APAP 325 every 6 hours as needed for pain. #3. Thrombophilia workup ordered. #4. Followup in 4 weeks with CBC and d-dimer.  I appreciate the opportunity of sharing in her care.   Doroteo Bradford, MD 05/23/2014 3:52 PM   DISCLAIMER:  This note was dictated with voice recognition softwre.  Similar sounding words can inadvertently be transcribed inaccurately and may not be corrected upon review.

## 2014-05-23 NOTE — Progress Notes (Signed)
[name] presented for labwork. Labs per MD order drawn via Peripheral Line 25 gauge needle inserted in rt ac.  Good blood return present. Procedure without incident.  Needle removed intact. Patient tolerated procedure well.

## 2014-05-23 NOTE — Patient Instructions (Signed)
..  [facility] Discharge Instructions  RECOMMENDATIONS MADE BY THE CONSULTANT AND ANY TEST RESULTS WILL BE SENT TO YOUR REFERRING PHYSICIAN.  EXAM FINDINGS BY THE PHYSICIAN TODAY AND SIGNS OR SYMPTOMS TO REPORT TO CLINIC OR PRIMARY PHYSICIAN: Exam and findings as discussed by Dr. Zigmund DanielFormanek.  MEDICATIONS PRESCRIBED:  Xarelto  INSTRUCTIONS/FOLLOW-UP: 4 weeks to see MD and lab work  Thank you for choosing [facility] to provide your oncology and hematology care.  To afford each patient quality time with our providers, please arrive at least 15 minutes before your scheduled appointment time.  With your help, our goal is to use those 15 minutes to complete the necessary work-up to ensure our physicians have the information they need to help with your evaluation and healthcare recommendations.    Effective January 1st, 2014, we ask that you re-schedule your appointment with our physicians should you arrive 10 or more minutes late for your appointment.  We strive to give you quality time with our providers, and arriving late affects you and other patients whose appointments are after yours.    Again, thank you for choosing [facility].  Our hope is that these requests will decrease the amount of time that you wait before being seen by our physicians.       _____________________________________________________________  Should you have questions after your visit to [facility], please contact our office at [phone] between the hours of 8:30 a.m. and 4:30 p.m.  Voicemails left after 4:30 p.m. will not be returned until the following business day.  For prescription refill requests, have your pharmacy contact our office with your prescription refill request.    _______________________________________________________________  We hope that we have given you very good care.  You may receive a patient satisfaction survey in the mail, please  complete it and return it as soon as possible.  We value your feedback!  _______________________________________________________________  Have you asked about our STAR program?  STAR stands for Survivorship Training and Rehabilitation, and this is a nationally recognized cancer care program that focuses on survivorship and rehabilitation.  Cancer and cancer treatments may cause problems, such as, pain, making you feel tired and keeping you from doing the things that you need or want to do. Cancer rehabilitation can help. Our goal is to reduce these troubling effects and help you have the best quality of life possible.  You may receive a survey from a nurse that asks questions about your current state of health.  Based on the survey results, all eligible patients will be referred to the [facility] for an evaluation so we can better serve you!  A frequently asked questions sheet is available upon request.

## 2014-05-24 LAB — LUPUS ANTICOAGULANT PANEL
DRVVT CONFIRMATION: 1.04 ratio (ref <1.15)
DRVVT: 57.8 secs — ABNORMAL HIGH (ref <42.9)
Lupus Anticoagulant: NOT DETECTED
PTT Lupus Anticoagulant: 41.2 secs (ref 28.0–43.0)
dRVVT Incubated 1:1 Mix: 46.2 secs — ABNORMAL HIGH (ref <42.9)

## 2014-05-24 LAB — BETA-2-GLYCOPROTEIN I ABS, IGG/M/A
Beta-2 Glyco I IgG: 0 G Units (ref <20)
Beta-2-Glycoprotein I IgA: 9 A Units (ref <20)
Beta-2-Glycoprotein I IgM: 6 M Units (ref <20)

## 2014-05-24 LAB — ANTITHROMBIN III: AntiThromb III Func: 100 % (ref 75–120)

## 2014-05-24 LAB — HOMOCYSTEINE: Homocysteine: 16.7 umol/L — ABNORMAL HIGH (ref 4.0–15.4)

## 2014-05-24 LAB — CARDIOLIPIN ANTIBODIES, IGG, IGM, IGA
Anticardiolipin IgA: 5 APL U/mL — ABNORMAL LOW (ref <22)
Anticardiolipin IgG: 8 GPL U/mL — ABNORMAL LOW (ref <23)
Anticardiolipin IgM: 11 MPL U/mL — ABNORMAL HIGH (ref <11)

## 2014-05-24 LAB — FERRITIN: FERRITIN: 85 ng/mL (ref 10–291)

## 2014-05-24 LAB — PROTEIN C ACTIVITY: Protein C Activity: 172 % — ABNORMAL HIGH (ref 75–133)

## 2014-05-24 LAB — PROTEIN S, TOTAL: Protein S Ag, Total: 80 % (ref 60–150)

## 2014-05-24 LAB — PROTEIN S ACTIVITY: Protein S Activity: 113 % (ref 69–129)

## 2014-05-24 LAB — PROTEIN C, TOTAL: Protein C, Total: 92 % (ref 72–160)

## 2014-05-29 ENCOUNTER — Other Ambulatory Visit (HOSPITAL_COMMUNITY): Payer: Self-pay | Admitting: Hematology and Oncology

## 2014-05-29 ENCOUNTER — Telehealth (HOSPITAL_COMMUNITY): Payer: Self-pay

## 2014-05-29 LAB — FACTOR 5 LEIDEN

## 2014-05-29 LAB — PROTHROMBIN GENE MUTATION

## 2014-05-29 MED ORDER — FOLIC ACID 1 MG PO TABS: 1.0000 mg | ORAL_TABLET | Freq: Every day | ORAL | Status: DC

## 2014-05-29 NOTE — Telephone Encounter (Signed)
Message left for patient regarding Folic Acid.  Call back confirmation requested.

## 2014-05-29 NOTE — Telephone Encounter (Signed)
Message copied by Evelena LeydenBURGESS, Harlyn Rathmann S on Mon May 29, 2014 12:14 PM ------      Message from: Alla GermanFORMANEK, GREGORY A      Created: Mon May 29, 2014  8:09 AM       Have ordered Folic Acid 1mg  daily from her pharmacy because of increased Homocysteine level.  Please notify her.  Thanks. Dr.F ------

## 2014-05-30 ENCOUNTER — Encounter (HOSPITAL_COMMUNITY): Payer: Self-pay

## 2014-05-30 NOTE — Telephone Encounter (Signed)
Letter sent notifying patient of prescription.

## 2014-06-03 DIAGNOSIS — D6851 Activated protein C resistance: Secondary | ICD-10-CM | POA: Insufficient documentation

## 2014-06-12 ENCOUNTER — Other Ambulatory Visit (HOSPITAL_COMMUNITY): Payer: Self-pay | Admitting: Oncology

## 2014-06-12 DIAGNOSIS — D6851 Activated protein C resistance: Secondary | ICD-10-CM

## 2014-06-12 MED ORDER — RIVAROXABAN 20 MG PO TABS
20.0000 mg | ORAL_TABLET | Freq: Every day | ORAL | Status: DC
Start: 2014-06-12 — End: 2014-09-18

## 2014-06-22 ENCOUNTER — Encounter (HOSPITAL_COMMUNITY): Payer: Self-pay

## 2014-06-22 ENCOUNTER — Encounter (HOSPITAL_COMMUNITY): Payer: Self-pay | Attending: Hematology and Oncology

## 2014-06-22 ENCOUNTER — Encounter (HOSPITAL_BASED_OUTPATIENT_CLINIC_OR_DEPARTMENT_OTHER): Payer: Self-pay

## 2014-06-22 VITALS — BP 110/52 | HR 65 | Temp 99.0°F | Resp 18 | Wt 176.6 lb

## 2014-06-22 DIAGNOSIS — D6851 Activated protein C resistance: Secondary | ICD-10-CM

## 2014-06-22 DIAGNOSIS — D6859 Other primary thrombophilia: Secondary | ICD-10-CM | POA: Insufficient documentation

## 2014-06-22 DIAGNOSIS — E7211 Homocystinuria: Secondary | ICD-10-CM

## 2014-06-22 DIAGNOSIS — I82819 Embolism and thrombosis of superficial veins of unspecified lower extremities: Secondary | ICD-10-CM | POA: Insufficient documentation

## 2014-06-22 DIAGNOSIS — I82412 Acute embolism and thrombosis of left femoral vein: Secondary | ICD-10-CM

## 2014-06-22 DIAGNOSIS — E7210 Disorders of sulfur-bearing amino-acid metabolism, unspecified: Secondary | ICD-10-CM | POA: Insufficient documentation

## 2014-06-22 LAB — CBC WITH DIFFERENTIAL/PLATELET
Basophils Absolute: 0 10*3/uL (ref 0.0–0.1)
Basophils Relative: 0 % (ref 0–1)
Eosinophils Absolute: 0.1 10*3/uL (ref 0.0–0.7)
Eosinophils Relative: 2 % (ref 0–5)
HCT: 36.8 % (ref 36.0–46.0)
HEMOGLOBIN: 12.5 g/dL (ref 12.0–15.0)
LYMPHS ABS: 2.4 10*3/uL (ref 0.7–4.0)
Lymphocytes Relative: 38 % (ref 12–46)
MCH: 30.9 pg (ref 26.0–34.0)
MCHC: 34 g/dL (ref 30.0–36.0)
MCV: 90.9 fL (ref 78.0–100.0)
MONOS PCT: 7 % (ref 3–12)
Monocytes Absolute: 0.4 10*3/uL (ref 0.1–1.0)
NEUTROS ABS: 3.3 10*3/uL (ref 1.7–7.7)
NEUTROS PCT: 53 % (ref 43–77)
Platelets: 353 10*3/uL (ref 150–400)
RBC: 4.05 MIL/uL (ref 3.87–5.11)
RDW: 13.1 % (ref 11.5–15.5)
WBC: 6.2 10*3/uL (ref 4.0–10.5)

## 2014-06-22 LAB — D-DIMER, QUANTITATIVE: D-Dimer, Quant: 0.39 ug/mL-FEU (ref 0.00–0.48)

## 2014-06-22 NOTE — Progress Notes (Signed)
LABS DRAWN FOR HOMOC,CBCD,DDIM.

## 2014-06-22 NOTE — Patient Instructions (Signed)
..  [facility] Discharge Instructions  RECOMMENDATIONS MADE BY THE CONSULTANT AND ANY TEST RESULTS WILL BE SENT TO YOUR REFERRING PHYSICIAN.  EXAM FINDINGS BY THE PHYSICIAN TODAY AND SIGNS OR SYMPTOMS TO REPORT TO CLINIC OR PRIMARY PHYSICIAN: Exam and findings as discussed by Dr. Zigmund DanielFormanek.  MEDICATIONS PRESCRIBED:  Continue xarelto/ anticoagulant therapy lifelong Your children should talk to their doctor and be checked for Factor V leiden clotting disorder Any female offspring who test positive should not take birth control pills  INSTRUCTIONS/FOLLOW-UP: 3 months with labs and MD visit  Thank you for choosing [facility] to provide your oncology and hematology care.  To afford each patient quality time with our providers, please arrive at least 15 minutes before your scheduled appointment time.  With your help, our goal is to use those 15 minutes to complete the necessary work-up to ensure our physicians have the information they need to help with your evaluation and healthcare recommendations.    Effective January 1st, 2014, we ask that you re-schedule your appointment with our physicians should you arrive 10 or more minutes late for your appointment.  We strive to give you quality time with our providers, and arriving late affects you and other patients whose appointments are after yours.    Again, thank you for choosing [facility].  Our hope is that these requests will decrease the amount of time that you wait before being seen by our physicians.       _____________________________________________________________  Should you have questions after your visit to [facility], please contact our office at [phone] between the hours of 8:30 a.m. and 4:30 p.m.  Voicemails left after 4:30 p.m. will not be returned until the following business day.  For prescription refill requests, have your pharmacy contact our office with your  prescription refill request.    _______________________________________________________________  We hope that we have given you very good care.  You may receive a patient satisfaction survey in the mail, please complete it and return it as soon as possible.  We value your feedback!  _______________________________________________________________  Have you asked about our STAR program?  STAR stands for Survivorship Training and Rehabilitation, and this is a nationally recognized cancer care program that focuses on survivorship and rehabilitation.  Cancer and cancer treatments may cause problems, such as, pain, making you feel tired and keeping you from doing the things that you need or want to do. Cancer rehabilitation can help. Our goal is to reduce these troubling effects and help you have the best quality of life possible.  You may receive a survey from a nurse that asks questions about your current state of health.  Based on the survey results, all eligible patients will be referred to the [facility] for an evaluation so we can better serve you!  A frequently asked questions sheet is available upon request.

## 2014-06-22 NOTE — Progress Notes (Signed)
[facility]  OFFICE PROGRESS NOTE  Alda LeaMcElroy, Tonya G, PA-C [facility] [address] [place] [id number]  DIAGNOSIS: Factor 5 Leiden mutation, heterozygous - Plan: CBC with Differential, D-dimer, quantitative  Thrombosis of left femoral vein - Plan: CBC with Differential, D-dimer, quantitative  Homocystinemia - Plan: Homocysteine, serum  Chief Complaint  Patient presents with  . Follow-up  . Thrombophilia    CURRENT THERAPY: Xarelto 20 mg daily. Folic acid 1 mg daily  INTERVAL HISTORY: Tonya EmmsDilcia Roberts 43 y.o. female returns for followup for left lower extremity nearly occlusive femoral vein thrombosis with previous thrombosis 14 years ago having undergone workup for thrombophilia with heterozygous factor V Leiden mutation determine along with hyper homocystinemia.  She continues on folic acid and Xarelto. She denies any epistaxis, melena, hematochezia, hematuria, or hemoptysis. She did experience her menstrual period from July 5 lasting 5 days with heavier bleeding than usual. Left lower extremity is still somewhat achy but without swelling or redness. She denies any chest pain, PND, orthopnea, palpitations.  MEDICAL HISTORY: Past Medical History  Diagnosis Date  . History of blood clots     1st blood clot was 4921yrs ago  . Factor 5 Leiden mutation, heterozygous 2015  . Anemia     INTERIM HISTORY: has Anemia, normocytic normochromic and Factor 5 Leiden mutation, heterozygous on her problem list.    ALLERGIES:  has No Known Allergies.  MEDICATIONS: has a current medication list which includes the following prescription(s): acetaminophen, folic acid, rivaroxaban, and hydrocodone-acetaminophen.  SURGICAL HISTORY: History reviewed. No pertinent past surgical history.  FAMILY HISTORY: family history is not on file.  SOCIAL HISTORY:  reports that she has never smoked. She does not have any smokeless tobacco  history on file. She reports that she does not drink alcohol or use illicit drugs.  REVIEW OF SYSTEMS:  Other than that discussed above is noncontributory.  PHYSICAL EXAMINATION: ECOG PERFORMANCE STATUS: 1 - Symptomatic but completely ambulatory  Blood pressure 110/52, pulse 65, temperature 99 F (37.2 C), temperature source Oral, resp. rate 18, weight 176 lb 9.6 oz (80.105 kg).  GENERAL:alert, no distress and comfortable SKIN: skin color, texture, turgor are normal, no rashes or significant lesions EYES: PERLA; Conjunctiva are pink and non-injected, sclera clear SINUSES: No redness or tenderness over maxillary or ethmoid sinuses OROPHARYNX:no exudate, no erythema on lips, buccal mucosa, or tongue. NECK: supple, thyroid normal size, non-tender, without nodularity. No masses CHEST: Normal AP diameter with no breast masses. LYMPH:  no palpable lymphadenopathy in the cervical, axillary or inguinal LUNGS: clear to auscultation and percussion with normal breathing effort HEART: regular rate & rhythm and no murmurs. P2 is tolerable at the apex. ABDOMEN:abdomen soft, non-tender and normal bowel sounds MUSCULOSKELETAL:no cyanosis of digits and no clubbing. Range of motion normal. Left lower extremity without swelling or redness. Homans sign negative. NEURO: alert & oriented x 3 with fluent speech, no focal motor/sensory deficits   LABORATORY DATA: Lab on 06/22/2014  Component Date Value Ref Range Status  . WBC 06/22/2014 6.2  4.0 - 10.5 K/uL Final  . RBC 06/22/2014 4.05  3.87 - 5.11 MIL/uL Final  . Hemoglobin 06/22/2014 12.5  12.0 - 15.0 g/dL Final  . HCT 60/45/409807/30/2015 36.8  36.0 - 46.0 % Final  . MCV 06/22/2014 90.9  78.0 - 100.0 fL Final  . MCH 06/22/2014 30.9  26.0 - 34.0 pg Final  . MCHC 06/22/2014 34.0  30.0 - 36.0 g/dL Final  . RDW 16/08/9603 13.1  11.5 - 15.5 % Final  . Platelets 06/22/2014 353  150 - 400 K/uL Final  . Neutrophils Relative % 06/22/2014 53  43 - 77 % Final  . Neutro  Abs 06/22/2014 3.3  1.7 - 7.7 K/uL Final  . Lymphocytes Relative 06/22/2014 38  12 - 46 % Final  . Lymphs Abs 06/22/2014 2.4  0.7 - 4.0 K/uL Final  . Monocytes Relative 06/22/2014 7  3 - 12 % Final  . Monocytes Absolute 06/22/2014 0.4  0.1 - 1.0 K/uL Final  . Eosinophils Relative 06/22/2014 2  0 - 5 % Final  . Eosinophils Absolute 06/22/2014 0.1  0.0 - 0.7 K/uL Final  . Basophils Relative 06/22/2014 0  0 - 1 % Final  . Basophils Absolute 06/22/2014 0.0  0.0 - 0.1 K/uL Final  . D-Dimer, Quant 06/22/2014 0.39  0.00 - 0.48 ug/mL-FEU Final   Comment:                                 AT THE INHOUSE ESTABLISHED CUTOFF                          VALUE OF 0.48 ug/mL FEU,                          THIS ASSAY HAS BEEN DOCUMENTED                          IN THE LITERATURE TO HAVE                          A SENSITIVITY AND NEGATIVE                          PREDICTIVE VALUE OF AT LEAST                          98 TO 99%.  THE TEST RESULT                          SHOULD BE CORRELATED WITH                          AN ASSESSMENT OF THE CLINICAL                          PROBABILITY OF DVT / VTE.    PATHOLOGY: No new pathology  Urinalysis No results found for this basename: colorurine,  appearanceur,  labspec,  phurine,  glucoseu,  hgbur,  bilirubinur,  ketonesur,  proteinur,  urobilinogen,  nitrite,  leukocytesur    RADIOGRAPHIC STUDIES: No results found.  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 #2. Multifocal deep venous thrombosis left lower extremity, improved on anticoagulants. #3. History of previous pelvic thrombosis.   PLAN:  #1. Continue Xarelto 20 mg daily lifetime. #2. Folic acid 1 mg daily lifetime with alteration in dose if homocystine level is not controlled. #3. Followup in 3 months with CBC, d-dimer, and homocystine level. Prescription was given for TED stockings above the knees with moderate  strength were  bilaterally. #4. Advised that children be tested for factor V Leiden mutation. Female children showed not take birth control pills if they manifest.the mutation and probably should take 81 mg aspirin daily.   All questions were answered. The patient knows to call the clinic with any problems, questions or concerns. We can certainly see the patient much sooner if necessary.   I spent 25 minutes counseling the patient face to face. The total time spent in the appointment was 30 minutes.    Maurilio Lovely, MD 06/22/2014 11:56 AM  DISCLAIMER:  This note was dictated with voice recognition software.  Similar sounding words can inadvertently be transcribed inaccurately and may not be corrected upon review.

## 2014-06-23 LAB — HOMOCYSTEINE: HOMOCYSTEINE-NORM: 7.8 umol/L (ref 4.0–15.4)

## 2014-09-18 ENCOUNTER — Other Ambulatory Visit (HOSPITAL_COMMUNITY): Payer: Self-pay | Admitting: Oncology

## 2014-09-21 ENCOUNTER — Ambulatory Visit (HOSPITAL_COMMUNITY): Payer: Self-pay

## 2014-09-21 ENCOUNTER — Other Ambulatory Visit (HOSPITAL_COMMUNITY): Payer: Self-pay

## 2014-09-22 NOTE — Progress Notes (Signed)
This encounter was created in error - please disregard.

## 2014-09-28 ENCOUNTER — Encounter (HOSPITAL_COMMUNITY): Payer: Self-pay

## 2014-11-29 ENCOUNTER — Encounter (HOSPITAL_COMMUNITY): Payer: Self-pay | Admitting: Hematology & Oncology

## 2014-11-29 ENCOUNTER — Encounter (HOSPITAL_BASED_OUTPATIENT_CLINIC_OR_DEPARTMENT_OTHER): Payer: Self-pay

## 2014-11-29 ENCOUNTER — Encounter (HOSPITAL_COMMUNITY): Payer: Self-pay | Attending: Hematology & Oncology | Admitting: Hematology & Oncology

## 2014-11-29 VITALS — BP 94/70 | HR 72 | Temp 98.6°F | Resp 18 | Wt 174.0 lb

## 2014-11-29 DIAGNOSIS — D688 Other specified coagulation defects: Secondary | ICD-10-CM

## 2014-11-29 DIAGNOSIS — Z86718 Personal history of other venous thrombosis and embolism: Secondary | ICD-10-CM | POA: Insufficient documentation

## 2014-11-29 DIAGNOSIS — I82402 Acute embolism and thrombosis of unspecified deep veins of left lower extremity: Secondary | ICD-10-CM

## 2014-11-29 DIAGNOSIS — D6851 Activated protein C resistance: Secondary | ICD-10-CM | POA: Insufficient documentation

## 2014-11-29 DIAGNOSIS — Z7901 Long term (current) use of anticoagulants: Secondary | ICD-10-CM | POA: Insufficient documentation

## 2014-11-29 DIAGNOSIS — E7211 Homocystinuria: Secondary | ICD-10-CM

## 2014-11-29 DIAGNOSIS — D649 Anemia, unspecified: Secondary | ICD-10-CM | POA: Insufficient documentation

## 2014-11-29 LAB — CBC WITH DIFFERENTIAL/PLATELET
BASOS PCT: 0 % (ref 0–1)
Basophils Absolute: 0 10*3/uL (ref 0.0–0.1)
Eosinophils Absolute: 0.1 10*3/uL (ref 0.0–0.7)
Eosinophils Relative: 1 % (ref 0–5)
HEMATOCRIT: 39.7 % (ref 36.0–46.0)
Hemoglobin: 13.1 g/dL (ref 12.0–15.0)
Lymphocytes Relative: 38 % (ref 12–46)
Lymphs Abs: 2.3 10*3/uL (ref 0.7–4.0)
MCH: 30.4 pg (ref 26.0–34.0)
MCHC: 33 g/dL (ref 30.0–36.0)
MCV: 92.1 fL (ref 78.0–100.0)
MONO ABS: 0.4 10*3/uL (ref 0.1–1.0)
Monocytes Relative: 6 % (ref 3–12)
NEUTROS PCT: 55 % (ref 43–77)
Neutro Abs: 3.2 10*3/uL (ref 1.7–7.7)
Platelets: 344 10*3/uL (ref 150–400)
RBC: 4.31 MIL/uL (ref 3.87–5.11)
RDW: 12.8 % (ref 11.5–15.5)
WBC: 6 10*3/uL (ref 4.0–10.5)

## 2014-11-29 LAB — D-DIMER, QUANTITATIVE (NOT AT ARMC): D DIMER QUANT: 0.4 ug{FEU}/mL (ref 0.00–0.48)

## 2014-11-29 MED ORDER — RIVAROXABAN 20 MG PO TABS: ORAL_TABLET | ORAL | Status: DC

## 2014-11-29 MED ORDER — RIVAROXABAN (XARELTO) VTE STARTER PACK (15 & 20 MG)
ORAL_TABLET | ORAL | Status: DC
Start: 2014-11-29 — End: 2015-12-06

## 2014-11-29 NOTE — Progress Notes (Signed)
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 (in [place])  CURRENT THERAPY: Xarelto  INTERVAL HISTORY: [name] 44 y.o. female returns for follow-up of factor V leiden mutation with 2 prior episodes of thrombosis. Both episodes were unprovoked. Her children have been checked and one of her daughters is positive.   She has been out of her Xarelto for one month. She is uninsured and has a hard time paying for doctor's visits.  She was able to secure Xarelto at no charge by working with the drug company.   MEDICAL HISTORY: Past Medical History  Diagnosis Date  . History of blood clots     1st blood clot was 35yrs ago  . Factor 5 Leiden mutation, heterozygous 2015  . Anemia     has Anemia, normocytic normochromic and Factor 5 Leiden mutation, heterozygous on her problem list.     No history exists.     has No Known Allergies.  [name] had no medications administered during this visit.  SURGICAL HISTORY: History reviewed. No pertinent past surgical history.  SOCIAL HISTORY: History   Social History  . Marital Status: Married    Spouse Name: N/A    Number of Children: N/A  . Years of Education: N/A   Occupational History  . Not on file.   Social History Main Topics  . Smoking status: Never Smoker   . Smokeless tobacco: Not on file  . Alcohol Use: No  . Drug Use: No  . Sexual Activity: Not on file   Other Topics Concern  . Not on file   Social History Narrative    FAMILY HISTORY:  She denies any clotting history in her family   Review of Systems  Constitutional: Negative for fever, chills,  weight loss and malaise/fatigue.  HENT: Negative for congestion, hearing loss, nosebleeds, sore throat and tinnitus.   Eyes: Negative for blurred vision, double vision, pain and discharge.  Respiratory: Negative for cough, hemoptysis, sputum production, shortness of breath and wheezing.   Cardiovascular: Negative for chest pain, palpitations, claudication, leg swelling and PND.  Gastrointestinal: Negative for heartburn, nausea, vomiting, abdominal pain, diarrhea, constipation, blood in stool and melena.  Genitourinary: Negative for dysuria, urgency, frequency and hematuria.  Musculoskeletal: Negative for myalgias, joint pain and falls.  Skin: Negative for itching and rash.  Neurological: Negative for dizziness, tingling, tremors, sensory change, speech change, focal weakness, seizures, loss of consciousness, weakness and headaches.  Endo/Heme/Allergies: Does not bruise/bleed easily.  Psychiatric/Behavioral: Negative for depression, suicidal ideas, memory loss and substance abuse. The patient is not nervous/anxious and does not have insomnia.     PHYSICAL EXAMINATION  ECOG PERFORMANCE STATUS: 0 - Asymptomatic  Filed Vitals:   11/29/14 1226  BP: 94/70  Pulse: 72  Temp: 98.6 F (37 C)  Resp: 18    Physical Exam  Constitutional: She is oriented to person, place, and time and well-developed, well-nourished, and in no distress.  HENT:  Head: Normocephalic and atraumatic.  Nose: Nose normal.  Mouth/Throat: Oropharynx is clear and moist. No oropharyngeal exudate.  Eyes: Conjunctivae and EOM are normal. Pupils are equal, round, and reactive to light. Right eye exhibits no  discharge. Left eye exhibits no discharge. No scleral icterus.  Neck: Normal range of motion. Neck supple. No tracheal deviation present. No thyromegaly present.  Cardiovascular: Normal rate, regular rhythm and normal heart sounds.  Exam reveals no gallop and no friction rub.   No murmur heard. Pulmonary/Chest: Effort  normal and breath sounds normal. She has no wheezes. She has no rales.  Abdominal: Soft. Bowel sounds are normal. She exhibits no distension and no mass. There is no tenderness. There is no rebound and no guarding.  Musculoskeletal: Normal range of motion. She exhibits no edema.  Lymphadenopathy:    She has no cervical adenopathy.  Neurological: She is alert and oriented to person, place, and time. She has normal reflexes. No cranial nerve deficit. Gait normal. Coordination normal.  Skin: Skin is warm and dry. No rash noted.  Psychiatric: Mood, memory, affect and judgment normal.  Nursing note and vitals reviewed.   LABORATORY DATA:  CBC    Component Value Date/Time   WBC 6.0 11/29/2014 1229   RBC 4.31 11/29/2014 1229   HGB 13.1 11/29/2014 1229   HCT 39.7 11/29/2014 1229   PLT 344 11/29/2014 1229   MCV 92.1 11/29/2014 1229   MCH 30.4 11/29/2014 1229   MCHC 33.0 11/29/2014 1229   RDW 12.8 11/29/2014 1229   LYMPHSABS 2.3 11/29/2014 1229   MONOABS 0.4 11/29/2014 1229   EOSABS 0.1 11/29/2014 1229   BASOSABS 0.0 11/29/2014 1229   CMP     Component Value Date/Time   NA 140 05/23/2014 1522   K 3.8 05/23/2014 1522   CL 103 05/23/2014 1522   CO2 24 05/23/2014 1522   GLUCOSE 94 05/23/2014 1522   BUN 17 05/23/2014 1522   CREATININE 0.81 05/23/2014 1522   CALCIUM 9.4 05/23/2014 1522   PROT 7.6 05/23/2014 1522   ALBUMIN 3.8 05/23/2014 1522   AST 18 05/23/2014 1522   ALT 16 05/23/2014 1522   ALKPHOS 53 05/23/2014 1522   BILITOT 0.2* 05/23/2014 1522   GFRNONAA 89* 05/23/2014 1522   GFRAA >90 05/23/2014 1522      ASSESSMENT and THERAPY PLAN:    Factor 5 Leiden mutation, heterozygous 44 year old female who is heterozygous for factor V Leiden. She has had 2 thrombotic episodes. The first was in [place]. She has been on Xarelto, it was recommended to her to be on it for life. She says she is unable to come to doctor's appointments because of the cost. Through the translator, I  explained to her that staying on long-term anticoagulation would require follow-ups with us I again readdressed the risks and benefits of long-term anticoagulation.  We discussed a 6 month follow-up, she does not feel able to do this but agrees to yearly follow-ups. I have refilled her Xarelto, she is able to obtain it through the drug company. I have advised her if she has problems prior to her year follow-up to please call or come in sooner. All of our conversation occurred with the help of Engineer, structuralpanish translator.     All questions were answered. The patient knows to call the clinic with any problems, questions or concerns. We can certainly see the patient much sooner if necessary. Arvil Chacoenland,Shannon Kristen 12/09/2014

## 2014-11-29 NOTE — Patient Instructions (Signed)
[facility] Discharge Instructions  RECOMMENDATIONS MADE BY THE CONSULTANT AND ANY TEST RESULTS WILL BE SENT TO YOUR REFERRING PHYSICIAN.  Please call with any problems or concerns prior to follow-up such as unusual bleeding or bruising We need to see you back if you are going to stay on your medication  Thank you for choosing [facility] to provide your oncology and hematology care.  To afford each patient quality time with our providers, please arrive at least 15 minutes before your scheduled appointment time.  With your help, our goal is to use those 15 minutes to complete the necessary work-up to ensure our physicians have the information they need to help with your evaluation and healthcare recommendations.    Effective January 1st, 2014, we ask that you re-schedule your appointment with our physicians should you arrive 10 or more minutes late for your appointment.  We strive to give you quality time with our providers, and arriving late affects you and other patients whose appointments are after yours.    Again, thank you for choosing [facility].  Our hope is that these requests will decrease the amount of time that you wait before being seen by our physicians.       _____________________________________________________________  Should you have questions after your visit to [facility], please contact our office at [phone] between the hours of 8:30 a.m. and 5:00 p.m.  Voicemails left after 4:30 p.m. will not be returned until the following business day.  For prescription refill requests, have your pharmacy contact our office with your prescription refill request.

## 2014-11-29 NOTE — Progress Notes (Signed)
LABS FOR CBCD,HMCY,DDIM

## 2014-11-30 ENCOUNTER — Telehealth (HOSPITAL_COMMUNITY): Payer: Self-pay

## 2014-11-30 NOTE — Telephone Encounter (Signed)
Homocystine level was not sent to referral lab in correct manner.  If needed, patient will need to come back for redraw.

## 2014-12-09 NOTE — Assessment & Plan Note (Signed)
44 year old female who is heterozygous for factor V Leiden. She has had 2 thrombotic episodes. The first was in [place]. She has been on Xarelto, it was recommended to her to be on it for life. She says she is unable to come to doctor's appointments because of the cost. Through the translator, I explained to her that staying on long-term anticoagulation would require follow-ups with us I again readdressed the risks and benefits of long-term anticoagulation.  We discussed a 6 month follow-up, she does not feel able to do this but agrees to yearly follow-ups. I have refilled her Xarelto, she is able to obtain it through the drug company. I have advised her if she has problems prior to her year follow-up to please call or come in sooner. All of our conversation occurred with the help of Engineer, structuralpanish translator.

## 2015-01-11 ENCOUNTER — Other Ambulatory Visit (HOSPITAL_COMMUNITY): Payer: Self-pay | Admitting: *Deleted

## 2015-01-11 DIAGNOSIS — Z1231 Encounter for screening mammogram for malignant neoplasm of breast: Secondary | ICD-10-CM

## 2015-01-15 ENCOUNTER — Ambulatory Visit (HOSPITAL_COMMUNITY)
Admission: RE | Admit: 2015-01-15 | Discharge: 2015-01-15 | Disposition: A | Discharge status: Home / Self Care | Payer: PRIVATE HEALTH INSURANCE | Source: Ambulatory Visit | Attending: *Deleted | Admitting: *Deleted

## 2015-01-15 DIAGNOSIS — Z1231 Encounter for screening mammogram for malignant neoplasm of breast: Secondary | ICD-10-CM | POA: Insufficient documentation

## 2015-03-06 ENCOUNTER — Other Ambulatory Visit (HOSPITAL_COMMUNITY)
Admission: RE | Admit: 2015-03-06 | Discharge: 2015-03-06 | Disposition: A | Discharge status: Home / Self Care | Payer: Self-pay | Source: Ambulatory Visit | Attending: Unknown Physician Specialty | Admitting: Unknown Physician Specialty

## 2015-03-06 ENCOUNTER — Other Ambulatory Visit (HOSPITAL_COMMUNITY)
Admission: RE | Admit: 2015-03-06 | Discharge: 2015-03-06 | Disposition: A | Discharge status: Home / Self Care | Payer: PRIVATE HEALTH INSURANCE | Source: Ambulatory Visit | Attending: Unknown Physician Specialty | Admitting: Unknown Physician Specialty

## 2015-03-06 DIAGNOSIS — R87610 Atypical squamous cells of undetermined significance on cytologic smear of cervix (ASC-US): Secondary | ICD-10-CM | POA: Insufficient documentation

## 2015-03-06 DIAGNOSIS — R87619 Unspecified abnormal cytological findings in specimens from cervix uteri: Secondary | ICD-10-CM | POA: Diagnosis not present

## 2015-03-09 LAB — CYTOLOGY - PAP

## 2015-08-02 ENCOUNTER — Other Ambulatory Visit (HOSPITAL_COMMUNITY): Payer: Self-pay | Admitting: Hematology & Oncology

## 2015-12-05 ENCOUNTER — Ambulatory Visit: Payer: Self-pay | Admitting: Physician Assistant

## 2015-12-06 ENCOUNTER — Encounter (HOSPITAL_COMMUNITY): Payer: Self-pay | Attending: Hematology & Oncology | Admitting: Hematology & Oncology

## 2015-12-06 ENCOUNTER — Encounter (HOSPITAL_COMMUNITY): Payer: Self-pay | Admitting: Hematology & Oncology

## 2015-12-06 ENCOUNTER — Ambulatory Visit (HOSPITAL_COMMUNITY): Payer: Self-pay | Admitting: Hematology & Oncology

## 2015-12-06 ENCOUNTER — Encounter: Payer: Self-pay | Admitting: Physician Assistant

## 2015-12-06 ENCOUNTER — Ambulatory Visit: Payer: Self-pay | Admitting: Physician Assistant

## 2015-12-06 VITALS — BP 119/66 | HR 89 | Temp 98.2°F | Resp 18 | Wt 175.5 lb

## 2015-12-06 VITALS — BP 112/64 | HR 89 | Temp 98.1°F | Ht 65.25 in | Wt 173.4 lb

## 2015-12-06 DIAGNOSIS — D6851 Activated protein C resistance: Secondary | ICD-10-CM

## 2015-12-06 DIAGNOSIS — I82412 Acute embolism and thrombosis of left femoral vein: Secondary | ICD-10-CM

## 2015-12-06 DIAGNOSIS — M79605 Pain in left leg: Secondary | ICD-10-CM

## 2015-12-06 DIAGNOSIS — Z0000 Encounter for general adult medical examination without abnormal findings: Secondary | ICD-10-CM

## 2015-12-06 DIAGNOSIS — E7211 Homocystinuria: Secondary | ICD-10-CM

## 2015-12-06 DIAGNOSIS — I82402 Acute embolism and thrombosis of unspecified deep veins of left lower extremity: Secondary | ICD-10-CM

## 2015-12-06 MED ORDER — RIVAROXABAN 20 MG PO TABS: 20.0000 mg | ORAL_TABLET | Freq: Every day | ORAL | Status: DC

## 2015-12-06 NOTE — Patient Instructions (Signed)
[facility] Discharge Instructions  RECOMMENDATIONS MADE BY THE CONSULTANT AND ANY TEST RESULTS WILL BE SENT TO YOUR REFERRING PHYSICIAN.  Exam and discussion with Dr. Galen ManilaPenland. Xarelto prescription with 11 refills sent to your pharmacy. Place a card in your wallet noting that you are on a blood thinner. You no longer have to come to this clinic unless you undergo surgery. Please call should you have any trouble with refilling your medication.  Thank you for choosing [facility] at [facility] to provide your oncology and hematology care.  To afford each patient quality time with our provider, please arrive at least 15 minutes before your scheduled appointment time.    You need to re-schedule your appointment should you arrive 10 or more minutes late.  We strive to give you quality time with our providers, and arriving late affects you and other patients whose appointments are after yours.  Also, if you no show three or more times for appointments you may be dismissed from the clinic at the providers discretion.     Again, thank you for choosing [facility].  Our hope is that these requests will decrease the amount of time that you wait before being seen by our physicians.       _____________________________________________________________  Should you have questions after your visit to [facility], please contact our office at [phone] between the hours of 8:30 a.m. and 4:30 p.m.  Voicemails left after 4:30 p.m. will not be returned until the following business day.  For prescription refill requests, have your pharmacy contact our office.

## 2015-12-06 NOTE — Progress Notes (Signed)
Jacquelin Hawking, PA-C [address] 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 (in [place])  CURRENT THERAPY: Xarelto  INTERVAL HISTORY: Haruko H Potenza 45 y.o. female returns for follow-up of factor V leiden mutation with 2 prior episodes of thrombosis. Both episodes were unprovoked. Her children have been checked and one of her daughters is positive. She is on XARELTO she has chosen to continue this lifelong.  Mrs. Baisch is accompanied by her husband today. She continues to take xarelto without issue and is able to obtain her medication without any problems.   She has new onset intermittent leg pain that travels from her L hip downwards. Denies new swelling or bruising. States that at work she sometimes has to "exercise up and down". This pain does not occur daily. If it hurts at night, she can sleep by taking a tylenol.   Denies bleeding. Continues to have periods that are sometimes heavy.   Mrs. Helm saw her PCP, Jacquelin Hawking PA-C, today. She was not given any new medications and had no concerns.   She has a mammogram scheduled in February.  MEDICAL HISTORY: Past Medical History  Diagnosis Date  . History of blood clots     1st blood clot was 26yrs ago  . Factor 5 Leiden mutation, heterozygous (HCC) 2015  . Anemia     has Anemia, normocytic normochromic and Factor 5 Leiden mutation, heterozygous (HCC) on her problem list.     No history exists.     has No Known Allergies.  Ms. Friend had no medications administered during this visit.  SURGICAL HISTORY: Past Surgical History  Procedure Laterality Date  . Tubal ligation      SOCIAL HISTORY: Social History   Social History  .  Marital Status: Married    Spouse Name: N/A  . Number of Children: N/A  . Years of Education: N/A   Occupational History  . Not on file.   Social History Main Topics  . Smoking status: Never Smoker   . Smokeless tobacco: Never Used  . Alcohol Use: No  . Drug Use: No  . Sexual Activity: Not on file   Other Topics Concern  . Not on file   Social History Narrative    FAMILY HISTORY:  She denies any clotting history in her family  Review of Systems  Constitutional: Negative for fever, chills, weight loss and malaise/fatigue.  HENT: Negative for congestion, hearing loss, nosebleeds, sore throat and tinnitus.   Eyes: Negative for blurred vision, double vision, pain and discharge.  Respiratory: Negative for cough, hemoptysis, sputum production, shortness of breath and wheezing.   Cardiovascular: Negative for chest pain, palpitations, claudication, leg swelling and PND.  Gastrointestinal: Negative for heartburn, nausea, vomiting, abdominal pain, diarrhea, constipation, blood in stool and melena.  Genitourinary: Negative for dysuria, urgency, frequency and hematuria.  Musculoskeletal: Negative for myalgias, joint pain and falls.       Intermittent leg pain.  Skin: Negative for itching and rash.  Neurological: Negative for dizziness, tingling, tremors, sensory change, speech change, focal weakness, seizures, loss of consciousness, weakness and headaches.  Endo/Heme/Allergies: Does not bruise/bleed easily.  Psychiatric/Behavioral: Negative for depression, suicidal ideas, memory loss and substance abuse. The patient is not nervous/anxious and  does not have insomnia.     PHYSICAL EXAMINATION  ECOG PERFORMANCE STATUS: 0 - Asymptomatic  Filed Vitals:   12/06/15 1243  BP: 119/66  Pulse: 89  Temp: 98.2 F (36.8 C)  Resp: 18    Physical Exam  Constitutional: She is oriented to person, place, and time and well-developed, well-nourished, and in no distress.  HENT:  Head:  Normocephalic and atraumatic.  Nose: Nose normal.  Mouth/Throat: Oropharynx is clear and moist. No oropharyngeal exudate.  Eyes: Conjunctivae and EOM are normal. Pupils are equal, round, and reactive to light. Right eye exhibits no discharge. Left eye exhibits no discharge. No scleral icterus.  Neck: Normal range of motion. Neck supple. No tracheal deviation present. No thyromegaly present.  Cardiovascular: Normal rate, regular rhythm and normal heart sounds.  Exam reveals no gallop and no friction rub.   No murmur heard. Pulmonary/Chest: Effort normal and breath sounds normal. She has no wheezes. She has no rales.  Abdominal: Soft. Bowel sounds are normal. She exhibits no distension and no mass. There is no tenderness. There is no rebound and no guarding.  Musculoskeletal: Normal range of motion. She exhibits no edema.  Complained of mild pain while palpating L inner upper thigh.  Lymphadenopathy:    She has no cervical adenopathy.  Neurological: She is alert and oriented to person, place, and time. She has normal reflexes. No cranial nerve deficit. Gait normal. Coordination normal.  Skin: Skin is warm and dry. No rash noted.  Psychiatric: Mood, memory, affect and judgment normal.  Nursing note and vitals reviewed.   LABORATORY DATA: I have reviewed the data as listed. CBC    Component Value Date/Time   WBC 6.0 11/29/2014 1229   RBC 4.31 11/29/2014 1229   HGB 13.1 11/29/2014 1229   HCT 39.7 11/29/2014 1229   PLT 344 11/29/2014 1229   MCV 92.1 11/29/2014 1229   MCH 30.4 11/29/2014 1229   MCHC 33.0 11/29/2014 1229   RDW 12.8 11/29/2014 1229   LYMPHSABS 2.3 11/29/2014 1229   MONOABS 0.4 11/29/2014 1229   EOSABS 0.1 11/29/2014 1229   BASOSABS 0.0 11/29/2014 1229   CMP     Component Value Date/Time   NA 140 05/23/2014 1522   K 3.8 05/23/2014 1522   CL 103 05/23/2014 1522   CO2 24 05/23/2014 1522   GLUCOSE 94 05/23/2014 1522   BUN 17 05/23/2014 1522   CREATININE 0.81  05/23/2014 1522   CALCIUM 9.4 05/23/2014 1522   PROT 7.6 05/23/2014 1522   ALBUMIN 3.8 05/23/2014 1522   AST 18 05/23/2014 1522   ALT 16 05/23/2014 1522   ALKPHOS 53 05/23/2014 1522   BILITOT 0.2* 05/23/2014 1522   GFRNONAA 89* 05/23/2014 1522   GFRAA >90 05/23/2014 1522     ASSESSMENT and THERAPY PLAN:  Thrombosis of Left femoral vein Homocystinemia, 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 (in [place])   I advised the patient to carry around information with her at all times that states she is taking a blood thinner so that others would be aware in any type of emergency situation.   She has a mammogram scheduled in February.  The patient continues to take xarelto and has had no issues. I have refilled the patient's xarelto prescription. I will release Mrs. Jefcoat to her PCP, Carollee Herter  Susa RaringMcElroy PA-C, who will continue her xarelto follow-up. We will continue to be in the loop if surgical interventions take place. We will follow-up as needed.  All questions were answered. The patient knows to call the clinic with any problems, questions or concerns. We can certainly see the patient much sooner if necessary.  This document serves as a record of services personally performed by Loma MessingShannon Hiedi Touchton, MD. It was created on her behalf by Delana MeyerElizabeth Ashley, a trained medical scribe. The creation of this record is based on the scribe's personal observations and the provider's statements to them. This document has been checked and approved by the attending provider.  I have reviewed the above documentation for accuracy and completeness, and I agree with the above.  Arvil ChacoPenland,Kentravious Lipford Kristen, MD  12/07/2015

## 2015-12-06 NOTE — Progress Notes (Signed)
BP 112/64 mmHg  Pulse 89  Temp(Src) 98.1 F (36.7 C)  Ht 5' 5.25" (1.657 m)  Wt 173 lb 6.4 oz (78.654 kg)  BMI 28.65 kg/m2  SpO2 99%   Subjective:    Patient ID: [name], female    DOB: [birth year], 45 y.o.   MRN: [id number]  HPI: [name] is a 45 y.o. female presenting on 12/06/2015 for Follow-up   HPI  Chief Complaint  Patient presents with  . Follow-up    pt states she feels good   Pt has appt with Dr Galen ManilaPenland this afternoon  States her leg is still hurts at times (where she had the blood clot) for about the past 2 months.   She has not had any swelling.    Nurse Manuella GhaziBerenice is translating today.  Relevant past medical, surgical, family and social history reviewed and updated as indicated. Interim medical history since our last visit reviewed. Allergies and medications reviewed and updated.  Current outpatient prescriptions:  .  acetaminophen (TYLENOL) 325 MG tablet, Take 650 mg by mouth every 6 (six) hours as needed for mild pain., Disp: , Rfl:  .  XARELTO 20 MG TABS tablet, TAKE 1 TABLET BY MOUTH DAILY WITH SUPPER., Disp: 30 tablet, Rfl: 6   Review of Systems  Constitutional: Negative for fever, chills, diaphoresis, appetite change, fatigue and unexpected weight change.  HENT: Negative for congestion, dental problem, drooling, ear pain, facial swelling, hearing loss, mouth sores, sneezing, sore throat, trouble swallowing and voice change.   Eyes: Positive for itching. Negative for pain, discharge, redness and visual disturbance.  Respiratory: Negative for cough, choking, shortness of breath and wheezing.   Cardiovascular: Negative for chest pain, palpitations and leg swelling.  Gastrointestinal: Negative for vomiting, abdominal pain, diarrhea, constipation and blood in stool.  Endocrine: Negative for cold intolerance, heat intolerance and polydipsia.  Genitourinary: Negative for dysuria, hematuria and decreased urine volume.  Musculoskeletal: Positive for  back pain. Negative for arthralgias and gait problem.  Skin: Negative for rash.  Allergic/Immunologic: Negative for environmental allergies.  Neurological: Negative for seizures, syncope, light-headedness and headaches.  Hematological: Negative for adenopathy.  Psychiatric/Behavioral: Negative for suicidal ideas, dysphoric mood and agitation. The patient is not nervous/anxious.     Per HPI unless specifically indicated above     Objective:    BP 112/64 mmHg  Pulse 89  Temp(Src) 98.1 F (36.7 C)  Ht 5' 5.25" (1.657 m)  Wt 173 lb 6.4 oz (78.654 kg)  BMI 28.65 kg/m2  SpO2 99%  Wt Readings from Last 3 Encounters:  12/06/15 173 lb 6.4 oz (78.654 kg)  11/29/14 174 lb (78.926 kg)  06/22/14 176 lb 9.6 oz (80.105 kg)    Physical Exam  Constitutional: She is oriented to person, place, and time. She appears well-developed and well-nourished.  HENT:  Head: Normocephalic and atraumatic.  Neck: Neck supple.  Cardiovascular: Normal rate, regular rhythm and intact distal pulses.   Pulmonary/Chest: Effort normal and breath sounds normal.  Abdominal: Soft. Bowel sounds are normal. She exhibits no mass. There is no hepatosplenomegaly. There is no tenderness.  Musculoskeletal: She exhibits no edema.       Right upper leg: Normal.       Left upper leg: Normal.  Lymphadenopathy:    She has no cervical adenopathy.  Neurological: She is alert and oriented to person, place, and time.  Skin: Skin is warm and dry.  Psychiatric: She has a normal mood and affect. Her behavior is normal.  Vitals reviewed.       Assessment & Plan:   Encounter Diagnosis  Name Primary?  . Healthcare maintenance Yes    -HCM up-to-date -f/u with Dr Galen Manila this afternoon as scheduled -F/u  Here 1 year. RTO sooner prn

## 2016-03-17 ENCOUNTER — Other Ambulatory Visit (HOSPITAL_COMMUNITY): Payer: Self-pay | Admitting: *Deleted

## 2016-03-17 DIAGNOSIS — Z1231 Encounter for screening mammogram for malignant neoplasm of breast: Secondary | ICD-10-CM

## 2016-03-26 ENCOUNTER — Ambulatory Visit (HOSPITAL_COMMUNITY): Payer: Self-pay

## 2016-03-26 ENCOUNTER — Other Ambulatory Visit (HOSPITAL_COMMUNITY): Payer: Self-pay | Admitting: *Deleted

## 2016-03-26 ENCOUNTER — Ambulatory Visit (HOSPITAL_COMMUNITY)
Admission: RE | Admit: 2016-03-26 | Discharge: 2016-03-26 | Disposition: A | Discharge status: Home / Self Care | Payer: PRIVATE HEALTH INSURANCE | Source: Ambulatory Visit | Attending: *Deleted | Admitting: *Deleted

## 2016-03-26 DIAGNOSIS — Z1231 Encounter for screening mammogram for malignant neoplasm of breast: Secondary | ICD-10-CM

## 2016-12-04 ENCOUNTER — Ambulatory Visit: Payer: Self-pay | Admitting: Physician Assistant

## 2016-12-10 ENCOUNTER — Ambulatory Visit: Payer: Self-pay | Admitting: Physician Assistant

## 2016-12-18 ENCOUNTER — Encounter: Payer: Self-pay | Admitting: Physician Assistant

## 2016-12-30 ENCOUNTER — Encounter: Payer: Self-pay | Admitting: Physician Assistant

## 2016-12-30 ENCOUNTER — Ambulatory Visit: Payer: Self-pay | Admitting: Physician Assistant

## 2016-12-30 VITALS — BP 90/74 | HR 68 | Temp 98.1°F | Ht 65.0 in | Wt 170.0 lb

## 2016-12-30 DIAGNOSIS — Z91199 Patient's noncompliance with other medical treatment and regimen due to unspecified reason: Secondary | ICD-10-CM

## 2016-12-30 DIAGNOSIS — G8929 Other chronic pain: Secondary | ICD-10-CM

## 2016-12-30 DIAGNOSIS — R51 Headache: Secondary | ICD-10-CM

## 2016-12-30 DIAGNOSIS — Z8639 Personal history of other endocrine, nutritional and metabolic disease: Secondary | ICD-10-CM

## 2016-12-30 DIAGNOSIS — Z9119 Patient's noncompliance with other medical treatment and regimen: Secondary | ICD-10-CM

## 2016-12-30 DIAGNOSIS — D649 Anemia, unspecified: Secondary | ICD-10-CM

## 2016-12-30 DIAGNOSIS — D6851 Activated protein C resistance: Secondary | ICD-10-CM

## 2016-12-30 NOTE — Patient Instructions (Signed)
Cefalea tensional  (Tension Headache)  Una cefalea tensional es una sensación de dolor o presión que suele manifestarse en la frente y los lados de la cabeza. Este es el tipo más común de dolor de cabeza. El dolor puede ser sordo o puede sentirse que comprime (constrictivo). Generalmente, no se asocia con náuseas o vómitos y no empeora con la actividad física. Las cefaleas tensionales pueden durar de 30 minutos a varios días.  CAUSAS  Se desconoce la causa exacta de esta afección. Suelen comenzar después de una situación de estrés, ansiedad o por depresión. Otros factores desencadenantes pueden ser los siguientes:  · Alcohol.  · Demasiada cafeína o abstinencia de cafeína.  · Infecciones respiratorias, como resfriados, gripes o sinusitis.  · Problemas dentales o apretar los dientes.  · Fatiga.  · Mantener la cabeza y el cuello en la misma posición durante un período prolongado, por ejemplo, al usar la computadora.  · Fumar.  SÍNTOMAS  Los síntomas de esta afección incluyen lo siguiente:  · Sensación de presión alrededor de la cabeza.  · Dolor "sordo" en la cabeza.  · Dolor que siente sobre la frente y los lados de la cabeza.  · Dolor a la palpación en los músculos de la cabeza, del cuello y de los hombros.  DIAGNÓSTICO  Esta afección se puede diagnosticar en función de los síntomas y de un examen físico. Pueden hacerle estudios, como una tomografía computarizada o una resonancia magnética de la cabeza. Estos estudios se indican si los síntomas son graves o fuera de lo común.  TRATAMIENTO  Esta afección puede tratarse con cambios en el estilo de vida y medicamentos que lo ayuden a aliviar los síntomas.  INSTRUCCIONES PARA EL CUIDADO EN EL HOGAR  Control del dolor  · Tome los medicamentos de venta libre y los recetados solamente como se lo haya indicado el médico.  · Cuando sienta dolor de cabeza acuéstese en un cuarto oscuro y tranquilo.  · Si se lo indican, aplique hielo sobre la cabeza y la zona del cuello:   ? Ponga el hielo en una bolsa plástica.  ? Coloque una toalla entre la piel y la bolsa de hielo.  ? Coloque el hielo durante 20 minutos, 2 a 3 veces por día.  · Utilice una almohadilla térmica o tome una ducha con agua caliente para aplicar calor en la cabeza y la zona del cuello como se lo haya indicado el médico.  Comida y bebida  · Mantenga un horario para las comidas.  · Limite el consumo de bebidas alcohólicas.  · Disminuya el consumo de cafeína o deje de consumir cafeína.  Instrucciones generales  · Concurra a todas las visitas de control como se lo haya indicado el médico. Esto es importante.  · Lleve un diario de los dolores de cabeza para averiguar qué factores pueden desencadenarlos. Por ejemplo, escriba los siguientes datos:  ? Lo que usted come y bebe.  ? Cuánto tiempo duerme.  ? Algún cambio en su dieta o en los medicamentos.  · Pruebe algunas técnicas de relajación, como los masajes.  · Limite el estrés.  · Siéntese derecho y evite tensionar los músculos.  · No consuma productos que contengan tabaco, incluidos cigarrillos, tabaco de mascar o cigarrillos electrónicos. Si necesita ayuda para dejar de fumar, consulte al médico.  · Haga actividad física habitualmente como se lo haya indicado el médico.  · Duerma entre 7 y 9 horas o la cantidad de horas que le haya recomendado el médico.  SOLICITE ATENCIÓN   MÉDICA SI:  · Los medicamentos no logran aliviar los síntomas.  · Tiene un dolor de cabeza que es diferente del dolor de cabeza habitual.  · Tiene náuseas o vómitos.  · Tiene fiebre.  SOLICITE ATENCIÓN MÉDICA DE INMEDIATO SI:  · El dolor se hace cada vez más intenso.  · Ha vomitado repetidas veces.  · Presenta rigidez en el cuello.  · Sufre pérdida de la visión.  · Tiene problemas para hablar.  · Siente dolor en el ojo o en el oído.  · Presenta debilidad muscular o pérdida del control muscular.  · Pierde el equilibrio o tiene problemas para caminar.  · Sufre mareos o se desmaya.  · Se siente confundido.   Esta información no tiene como fin reemplazar el consejo del médico. Asegúrese de hacerle al médico cualquier pregunta que tenga.  Document Released: 08/20/2005 Document Revised: 08/01/2015  Elsevier Interactive Patient Education © 2017 Elsevier Inc.

## 2016-12-30 NOTE — Progress Notes (Signed)
BP 90/74 (BP Location: Left Arm, Patient Position: Sitting, Cuff Size: Normal)   Pulse 68   Temp 98.1 F (36.7 C) (Other (Comment))   Ht 5\' 5"  (1.651 m)   Wt 170 lb (77.1 kg)   LMP 12/25/2016 (Exact Date)   SpO2 98%   BMI 28.29 kg/m    Subjective:    Patient ID: [name], female    DOB: 06/02/1971, 46 y.o.   MRN: [id number]  HPI: [name] is a 46 y.o. female presenting on 12/30/2016 for Follow-up   HPI    Pt is not taking any medications at all.  She says she thought that Dr Galen ManilaPenland told her to stop it.  She stopped the xaralto in august.  She took the xaralto for 5 or 6 months after her last OV with Dr Galen ManilaPenland in January.    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 (in [place]    Pt c/o headache for at least 5 years without recent changes.  Says it hurts all the time.  Relevant past medical, surgical, family and social history reviewed and updated as indicated. Interim medical history since our last visit reviewed. Allergies and medications reviewed and updated.   Current Outpatient Prescriptions:  .  acetaminophen (TYLENOL) 325 MG tablet, Take 650 mg by mouth every 6 (six) hours as needed for mild pain., Disp: , Rfl:  .  rivaroxaban (XARELTO) 20 MG TABS tablet, Take 1 tablet (20 mg total) by mouth daily with supper. (Patient not taking: Reported on 12/30/2016), Disp: 30 tablet, Rfl: 11   Review of Systems  Constitutional: Negative for appetite change, chills, diaphoresis, fatigue, fever and unexpected weight change.  HENT: Negative for congestion, dental problem, drooling, ear pain, facial swelling, hearing loss, mouth sores, sneezing, sore throat, trouble swallowing and voice change.   Eyes: Negative for  pain, discharge, redness, itching and visual disturbance.  Respiratory: Negative for cough, choking, shortness of breath and wheezing.   Cardiovascular: Negative for chest pain, palpitations and leg swelling.  Gastrointestinal: Negative for abdominal pain, blood in stool, constipation, diarrhea and vomiting.  Endocrine: Negative for cold intolerance, heat intolerance and polydipsia.  Genitourinary: Negative for decreased urine volume, dysuria and hematuria.  Musculoskeletal: Negative for arthralgias, back pain and gait problem.  Skin: Negative for rash.  Allergic/Immunologic: Negative for environmental allergies.  Neurological: Positive for headaches. Negative for seizures, syncope and light-headedness.  Hematological: Negative for adenopathy.  Psychiatric/Behavioral: Negative for agitation, dysphoric mood and suicidal ideas. The patient is not nervous/anxious.     Per HPI unless specifically indicated above     Objective:    BP 90/74 (BP Location: Left Arm, Patient Position: Sitting, Cuff Size: Normal)   Pulse 68   Temp 98.1 F (36.7 C) (Other (Comment))   Ht 5\' 5"  (1.651 m)   Wt 170 lb (77.1 kg)   LMP 12/25/2016 (Exact Date)   SpO2 98%   BMI 28.29 kg/m   Wt Readings from Last 3 Encounters:  12/30/16 170 lb (77.1 kg)  12/06/15 175 lb 8 oz (79.6 kg)  12/06/15 173 lb 6.4 oz (78.7 kg)    Physical Exam  Constitutional: She is oriented to person, place, and time. She appears well-developed and well-nourished.  HENT:  Head: Normocephalic and atraumatic.  Right Ear: Hearing, tympanic membrane, external ear and ear canal normal.  Left Ear: Hearing, tympanic membrane, external ear and ear canal normal.  Nose: Nose normal.  Mouth/Throat: Uvula is midline and oropharynx is clear and moist. No oropharyngeal exudate.  Neck: Neck supple.  Cardiovascular: Normal rate and regular rhythm.   Pulmonary/Chest: Effort normal and breath sounds normal. She has no wheezes.  Abdominal: Soft.  Bowel sounds are normal. She exhibits no mass. There is no hepatosplenomegaly. There is no tenderness.  Musculoskeletal: She exhibits no edema.  Lymphadenopathy:    She has no cervical adenopathy.  Neurological: She is alert and oriented to person, place, and time. She has normal strength and normal reflexes. She displays no atrophy and no tremor. No cranial nerve deficit or sensory deficit. She exhibits normal muscle tone. She displays a negative Romberg sign. Coordination and gait normal.  Skin: Skin is warm and dry.  Psychiatric: She has a normal mood and affect. Her behavior is normal.  Vitals reviewed.           Assessment & Plan:    Encounter Diagnoses  Name Primary?  . Factor 5 Leiden mutation, heterozygous (H[facility]) Yes  . History of hyperlipidemia   . Anemia, normocytic normochromic   . Chronic nonintractable headache, unspecified headache type   . Personal history of noncompliance with medical treatment, presenting hazards to health     -Pt states she will only go to [facility] for PAPs- record request sent for most recent PAP -Filled out xaralto pt assistance applicatition.  Pt counseled at length about the risks associated with stopping her anticoagulant.  She states understanding -pt to get blood drawn tomorrow to Check fasting labs  -Pt to f/u 1 month to make sure she got meds and review labs.  RTO sooner prn  (spent 30 minutes with pt over half of which was with counseling and education)

## 2017-01-02 LAB — CBC WITH DIFFERENTIAL/PLATELET
BASOS ABS: 0 {cells}/uL (ref 0–200)
Basophils Relative: 0 %
EOS ABS: 138 {cells}/uL (ref 15–500)
Eosinophils Relative: 3 %
HCT: 35.9 % (ref 35.0–45.0)
HEMOGLOBIN: 11.8 g/dL (ref 11.7–15.5)
LYMPHS ABS: 1840 {cells}/uL (ref 850–3900)
Lymphocytes Relative: 40 %
MCH: 30.6 pg (ref 27.0–33.0)
MCHC: 32.9 g/dL (ref 32.0–36.0)
MCV: 93.2 fL (ref 80.0–100.0)
MPV: 9.3 fL (ref 7.5–12.5)
Monocytes Absolute: 506 cells/uL (ref 200–950)
Monocytes Relative: 11 %
NEUTROS PCT: 46 %
Neutro Abs: 2116 cells/uL (ref 1500–7800)
Platelets: 322 10*3/uL (ref 140–400)
RBC: 3.85 MIL/uL (ref 3.80–5.10)
RDW: 13.9 % (ref 11.0–15.0)
WBC: 4.6 10*3/uL (ref 3.8–10.8)

## 2017-01-02 LAB — LIPID PANEL
Cholesterol: 171 mg/dL (ref <200)
HDL: 46 mg/dL — ABNORMAL LOW (ref >50)
LDL CALC: 114 mg/dL — AB (ref <100)
TRIGLYCERIDES: 53 mg/dL (ref <150)
Total CHOL/HDL Ratio: 3.7 Ratio (ref <5.0)
VLDL: 11 mg/dL (ref <30)

## 2017-01-21 ENCOUNTER — Other Ambulatory Visit: Payer: Self-pay | Admitting: Physician Assistant

## 2017-01-21 MED ORDER — RIVAROXABAN 20 MG PO TABS: ORAL_TABLET | ORAL | 1 refills | Status: DC

## 2017-01-27 ENCOUNTER — Encounter: Payer: Self-pay | Admitting: Physician Assistant

## 2017-01-27 ENCOUNTER — Ambulatory Visit: Payer: Self-pay | Admitting: Physician Assistant

## 2017-01-27 VITALS — BP 106/68 | HR 69 | Temp 97.9°F | Ht 65.0 in | Wt 170.5 lb

## 2017-01-27 DIAGNOSIS — E785 Hyperlipidemia, unspecified: Secondary | ICD-10-CM

## 2017-01-27 DIAGNOSIS — D6851 Activated protein C resistance: Secondary | ICD-10-CM

## 2017-01-27 DIAGNOSIS — Z7901 Long term (current) use of anticoagulants: Secondary | ICD-10-CM

## 2017-01-27 NOTE — Progress Notes (Signed)
BP 106/68 (BP Location: Left Arm, Patient Position: Sitting, Cuff Size: Normal)   Pulse 69   Temp 97.9 F (36.6 C) (Other (Comment))   Ht '5\' 5"'$  (1.651 m)   Wt 170 lb 8 oz (77.3 kg)   LMP 01/22/2017 (Exact Date)   SpO2 99%   BMI 28.37 kg/m    Subjective:    Patient ID: [name], female    DOB: 1971-10-07, 46 y.o.   MRN: [id number]  HPI: [name] is a 46 y.o. female presenting on 01/27/2017 for Anticoagulation   HPI  Pt doing well today. No complaints.   Relevant past medical, surgical, family and social history reviewed and updated as indicated. Interim medical history since our last visit reviewed. Allergies and medications reviewed and updated.   Current Outpatient Prescriptions:  .  acetaminophen (TYLENOL) 325 MG tablet, Take 650 mg by mouth every 6 (six) hours as needed for mild pain., Disp: , Rfl:  .  rivaroxaban (XARELTO) 20 MG TABS tablet, 1 po qd with supper.  Tome una tableta por boca diaria con la cena, Disp: 30 tablet, Rfl: 1   Review of Systems  Constitutional: Negative for appetite change, chills, diaphoresis, fatigue, fever and unexpected weight change.  HENT: Negative for congestion, dental problem, drooling, ear pain, facial swelling, hearing loss, mouth sores, sneezing, sore throat, trouble swallowing and voice change.   Eyes: Negative for pain, discharge, redness, itching and visual disturbance.  Respiratory: Negative for cough, choking, shortness of breath and wheezing.   Cardiovascular: Negative for chest pain, palpitations and leg swelling.  Gastrointestinal: Negative for abdominal pain, blood in stool, constipation, diarrhea and vomiting.  Endocrine: Negative for cold intolerance, heat intolerance and polydipsia.  Genitourinary: Negative for decreased urine volume, dysuria and hematuria.  Musculoskeletal: Negative for arthralgias, back pain and gait problem.  Skin: Negative for rash.  Allergic/Immunologic: Negative for environmental  allergies.  Neurological: Negative for seizures, syncope, light-headedness and headaches.  Hematological: Negative for adenopathy.  Psychiatric/Behavioral: Negative for agitation, dysphoric mood and suicidal ideas. The patient is not nervous/anxious.     Per HPI unless specifically indicated above     Objective:    BP 106/68 (BP Location: Left Arm, Patient Position: Sitting, Cuff Size: Normal)   Pulse 69   Temp 97.9 F (36.6 C) (Other (Comment))   Ht '5\' 5"'$  (1.651 m)   Wt 170 lb 8 oz (77.3 kg)   LMP 01/22/2017 (Exact Date)   SpO2 99%   BMI 28.37 kg/m   Wt Readings from Last 3 Encounters:  01/27/17 170 lb 8 oz (77.3 kg)  12/30/16 170 lb (77.1 kg)  12/06/15 175 lb 8 oz (79.6 kg)    Physical Exam  Constitutional: She is oriented to person, place, and time. She appears well-developed and well-nourished.  HENT:  Head: Normocephalic and atraumatic.  Neck: Neck supple.  Cardiovascular: Normal rate and regular rhythm.   Pulmonary/Chest: Effort normal and breath sounds normal.  Abdominal: Soft. Bowel sounds are normal. She exhibits no mass. There is no hepatosplenomegaly. There is no tenderness.  Musculoskeletal: She exhibits no edema.  Lymphadenopathy:    She has no cervical adenopathy.  Neurological: She is alert and oriented to person, place, and time.  Skin: Skin is warm and dry.  Psychiatric: She has a normal mood and affect. Her behavior is normal.  Vitals reviewed.   Results for orders placed or performed in visit on 12/30/16  CBC w/Diff/Platelet  Result Value Ref Range   WBC 4.6  3.8 - 10.8 K/uL   RBC 3.85 3.80 - 5.10 MIL/uL   Hemoglobin 11.8 11.7 - 15.5 g/dL   HCT 35.9 35.0 - 45.0 %   MCV 93.2 80.0 - 100.0 fL   MCH 30.6 27.0 - 33.0 pg   MCHC 32.9 32.0 - 36.0 g/dL   RDW 13.9 11.0 - 15.0 %   Platelets 322 140 - 400 K/uL   MPV 9.3 7.5 - 12.5 fL   Neutro Abs 2,116 1,500 - 7,800 cells/uL   Lymphs Abs 1,840 850 - 3,900 cells/uL   Monocytes Absolute 506 200 - 950  cells/uL   Eosinophils Absolute 138 15 - 500 cells/uL   Basophils Absolute 0 0 - 200 cells/uL   Neutrophils Relative % 46 %   Lymphocytes Relative 40 %   Monocytes Relative 11 %   Eosinophils Relative 3 %   Basophils Relative 0 %   Smear Review Criteria for review not met   Lipid Profile  Result Value Ref Range   Cholesterol 171 <200 mg/dL   Triglycerides 53 <150 mg/dL   HDL 46 (L) >50 mg/dL   Total CHOL/HDL Ratio 3.7 <5.0 Ratio   VLDL 11 <30 mg/dL   LDL Cholesterol 114 (H) <100 mg/dL      Assessment & Plan:   Encounter Diagnoses  Name Primary?  . Current use of long term anticoagulation Yes  . Factor 5 Leiden mutation, heterozygous ([facility])   . Hyperlipidemia, unspecified hyperlipidemia type      -reviewed labs with pt -order mammogram in may -pt to follow [place] pt on signs bleeding- she would need to return to office for any of these -follow up 6 months.  RTO sooner prn

## 2017-01-27 NOTE — Patient Instructions (Signed)
Dieta restringida en grasas y colesterol (Fat and Cholesterol Restricted Diet) Los niveles altos de grasa y colesterol en la sangre pueden causar varios problemas de salud, como enfermedades del corazn, de los vasos sanguneos, de la vescula, del hgado y del pncreas. Las grasas son fuentes de energa concentrada que existen en varias formas. Consumir en exceso ciertos tipos de grasa, incluidas las grasas saturadas, puede ser perjudicial. El colesterol es una sustancia que el organismo necesita en pequeas cantidades. El cuerpo fabrica todo el colesterol que necesita. El exceso de colesterol proviene de los alimentos que come. Si sus niveles de colesterol y grasas saturadas en la sangre son elevados, puede tener problemas de salud, dado que el exceso de grasa y colesterol se acumula en las paredes de los vasos sanguneos, provocando su estrechamiento. Elegir los alimentos apropiados le permitir controlar su ingesta de grasa y colesterol. Esto le ayudar a mantener los niveles de estas sustancias en la sangre dentro de los lmites normales y a reducir el riesgo de contraer enfermedades. EN QU CONSISTE EL PLAN? El mdico le recomienda que:  Limite la ingesta de grasas a alrededor del _______% del total de caloras por da.  Limite la cantidad de colesterol en su dieta a menos de _________mg por da.  Consuma entre 20 y 30gramos de fibra todos los das. QU TIPOS DE GRASAS DEBO ELEGIR?  Elija grasas saludables con mayor frecuencia. Elija las grasas monoinsaturadas y poliinsaturadas, como el aceite de oliva y canola, las semillas de lino, las nueces, las almendras y las semillas.  Consuma ms grasas omega-3. Las mejores opciones incluyen salmn, caballa, sardinas, atn, aceite de lino y semillas de lino molidas. Trate de consumir pescado al menos dos veces por semana.  Limite el consumo de grasas saturadas. Estas se encuentran principalmente en los productos de origen animal, como las carnes,  la mantequilla y la crema. Las grasas saturadas de origen vegetal incluyen aceite de palma, de palmiste y de coco.  Evite los alimentos con aceites parcialmente hidrogenados. Estos contienen grasas trans. Entre los ejemplos de alimentos con grasas trans se incluyen margarinas en barra, algunas margarinas untables, galletas dulces o saladas y otros productos horneados.  QU PAUTAS GENERALES DEBO SEGUIR? Estas pautas para una alimentacin saludable le ayudarn a controlar su ingesta de grasa y colesterol:  Lea las etiquetas de los alimentos detenidamente para identificar los que contienen grasas trans o altas cantidades de grasas saturadas.  Llene la mitad del plato con verduras y ensaladas de hojas verdes.  Llene un cuarto del plato con cereales integrales. Busque la palabra "integral" en el primer lugar de la lista de ingredientes.  Llene un cuarto del plato con alimentos con protenas magras.  Limite las frutas a dos porciones por da. Elija frutas en lugar de jugos.  Coma ms alimentos que contienen fibra, como manzanas, brcoli, zanahorias, frijoles, guisantes y cebada.  Consuma ms comida casera y menos de restaurante, de buf y comida rpida.  Limite o evite el alcohol.  Limite los alimentos con alto contenido de almidn y azcar.  Limite el consumo de alimentos fritos.  Cocine los alimentos utilizando mtodos que no sean la fritura. Las opciones de coccin ms adecuadas son hornear, hervir, grillar y asar a la parrilla.  Baje de peso si es necesario. Perder solo del 5 al 10% de su peso inicial puede ayudarle a mejorar su estado de salud general y a prevenir enfermedades, como la diabetes y las enfermedades cardacas. QU ALIMENTOS PUEDO COMER? Cereales Cereales integrales,   como los panes de salvado o integrales, las galletas, los cereales y las pastas. Avena sin endulzar, trigo, cebada, quinua o arroz integral. Tortillas de harina de maz o de salvado. Verduras Verduras  frescas o congeladas (crudas, al vapor, asadas o grilladas). Ensaladas de hojas verdes. Frutas Frutas frescas, en conserva (en su jugo natural) o frutas congeladas. Carnes y otros productos con protenas Carne de res molida (al 85% o ms magra), carne de res de animales alimentados con pastos o carne de res sin la grasa. Pollo o pavo sin piel. Carne de pollo o de pavo molida. Cerdo sin la grasa. Todos los pescados y frutos de mar. Huevos. Porotos, guisantes o lentejas secos. Frutos secos o semillas sin sal. Frijoles secos o en lata sin sal. Lcteos Productos lcteos con bajo contenido de grasas, como leche descremada o al 1%, quesos reducidos en grasas o al 2%, ricota con bajo contenido de grasas o queso cottage, o yogur natural con bajo contenido de grasas. Grasas y aceites Margarinas untables que no contengan grasas trans. [place]onesa y condimentos para ensaladas livianos o reducidos en grasas. [place]. Aceites de oliva, canola, ssamo o crtamo. Mantequilla natural de cacahuate o almendra (elija la que no tenga agregado de aceite o azcar). Los artculos mencionados arriba pueden no ser una lista completa de las bebidas o los alimentos recomendados. Comunquese con el nutricionista para conocer ms opciones. QU ALIMENTOS NO SE RECOMIENDAN? Cereales Pan blanco. Pastas blancas. Arroz blanco. Pan de maz. Bagels, pasteles y croissants. Galletas saladas que contengan grasas trans. Verduras Papas blancas. Maz. Verduras con crema o fritas. Verduras en salsa de queso. Frutas Frutas secas. Fruta enlatada en almbar liviano o espeso. Jugo de frutas. Carnes y otros productos con protenas Cortes de carne con grasa. Costillas, alas de pollo, tocineta, salchicha, mortadela, salame, chinchulines, tocino, perros calientes, salchichas alemanas y embutidos envasados. Hgado y otros rganos. Lcteos Leche entera o al 2%, crema, mezcla de leche y crema, y queso crema. Quesos enteros. Yogur entero o  endulzado. Quesos con toda su grasa. Cremas no lcteas y coberturas batidas. Quesos procesados, quesos para untar o cuajadas. Dulces y postres Jarabe de maz, azcares, miel y melazas. Caramelos. Mermelada y jalea. Jarabe. Cereales endulzados. Galletas, pasteles, bizcochuelos, donas, muffins y helado. Grasas y aceites Mantequilla, margarina en barra, manteca de cerdo, grasa, mantequilla clarificada o grasa de tocino. Aceites de coco, de palmiste o de palma. Bebidas Alcohol. Bebidas endulzadas (como refrescos, limonadas y bebidas frutales o ponches). Los artculos mencionados arriba pueden no ser una lista completa de las bebidas y los alimentos que se deben evitar. Comunquese con el nutricionista para obtener ms informacin. Esta informacin no tiene como fin reemplazar el consejo del mdico. Asegrese de hacerle al mdico cualquier pregunta que tenga. Document Released: 11/10/2005 Document Revised: 12/01/2014 Document Reviewed: 02/08/2014 Elsevier Interactive Patient Education  2017 [employer] Inc.  

## 2017-02-25 ENCOUNTER — Encounter: Payer: Self-pay | Admitting: Physician Assistant

## 2017-03-26 ENCOUNTER — Other Ambulatory Visit: Payer: Self-pay | Admitting: Physician Assistant

## 2017-03-26 MED ORDER — RIVAROXABAN 20 MG PO TABS: ORAL_TABLET | ORAL | 1 refills | Status: DC

## 2017-07-30 ENCOUNTER — Ambulatory Visit: Payer: Self-pay | Admitting: Physician Assistant

## 2017-08-06 ENCOUNTER — Other Ambulatory Visit (HOSPITAL_COMMUNITY)
Admission: RE | Admit: 2017-08-06 | Discharge: 2017-08-06 | Disposition: A | Discharge status: Home / Self Care | Payer: PRIVATE HEALTH INSURANCE | Source: Ambulatory Visit | Attending: Physician Assistant | Admitting: Physician Assistant

## 2017-08-06 ENCOUNTER — Encounter: Payer: Self-pay | Admitting: Physician Assistant

## 2017-08-06 ENCOUNTER — Ambulatory Visit: Payer: Self-pay | Admitting: Physician Assistant

## 2017-08-06 VITALS — BP 110/74 | HR 57 | Temp 97.7°F | Ht 65.0 in | Wt 171.0 lb

## 2017-08-06 DIAGNOSIS — Z7901 Long term (current) use of anticoagulants: Secondary | ICD-10-CM

## 2017-08-06 DIAGNOSIS — Z1239 Encounter for other screening for malignant neoplasm of breast: Secondary | ICD-10-CM

## 2017-08-06 DIAGNOSIS — D6851 Activated protein C resistance: Secondary | ICD-10-CM | POA: Insufficient documentation

## 2017-08-06 LAB — CBC
HCT: 39.3 % (ref 36.0–46.0)
HEMOGLOBIN: 13.2 g/dL (ref 12.0–15.0)
MCH: 31.2 pg (ref 26.0–34.0)
MCHC: 33.6 g/dL (ref 30.0–36.0)
MCV: 92.9 fL (ref 78.0–100.0)
Platelets: 335 10*3/uL (ref 150–400)
RBC: 4.23 MIL/uL (ref 3.87–5.11)
RDW: 13.1 % (ref 11.5–15.5)
WBC: 7.4 10*3/uL (ref 4.0–10.5)

## 2017-08-06 MED ORDER — RIVAROXABAN 20 MG PO TABS: ORAL_TABLET | ORAL | 1 refills | Status: DC

## 2017-08-06 NOTE — Progress Notes (Signed)
BP 110/74 (BP Location: Left Arm, Patient Position: Sitting, Cuff Size: Normal)   Pulse (!) 57   Temp 97.7 F (36.5 C) (Other (Comment))   Ht 5\' 5"  (1.651 m)   Wt 171 lb (77.6 kg)   LMP 07/26/2017 (Exact Date)   SpO2 98%   BMI 28.46 kg/m    Subjective:    Patient ID: [name], female    DOB: 1971-06-27, 46 y.o.   MRN: [id number]  HPI: [name] is a 46 y.o. female presenting on 08/06/2017 for Follow-up (discuss mammogram)   HPI   No signs bleeding  She ran out of the xarelto in august.  She did not bother to notify office.   Relevant past medical, surgical, family and social history reviewed and updated as indicated. Interim medical history since our last visit reviewed. Allergies and medications reviewed and updated.   Current Outpatient Prescriptions:  .  acetaminophen (TYLENOL) 325 MG tablet, Take 650 mg by mouth every 6 (six) hours as needed for mild pain., Disp: , Rfl:  .  rivaroxaban (XARELTO) 20 MG TABS tablet, 1 po qd with supper.  Tome una tableta por boca diaria con la cena (Patient not taking: Reported on 08/06/2017), Disp: 30 tablet, Rfl: 1   Review of Systems  Constitutional: Negative for appetite change, chills, diaphoresis, fatigue, fever and unexpected weight change.  HENT: Negative for congestion, drooling, ear pain, facial swelling, hearing loss, mouth sores, sneezing, sore throat, trouble swallowing and voice change.   Eyes: Negative for pain, discharge, redness, itching and visual disturbance.  Respiratory: Negative for cough, choking, shortness of breath and wheezing.   Cardiovascular: Negative for chest pain, palpitations and leg swelling.  Gastrointestinal: Negative for abdominal pain, blood in stool, constipation, diarrhea and vomiting.  Endocrine: Negative for cold intolerance, heat intolerance and polydipsia.  Genitourinary: Negative for decreased urine volume, dysuria and hematuria.  Musculoskeletal: Negative for arthralgias, back pain  and gait problem.  Skin: Negative for rash.  Allergic/Immunologic: Negative for environmental allergies.  Neurological: Positive for headaches. Negative for seizures, syncope and light-headedness.  Hematological: Negative for adenopathy.  Psychiatric/Behavioral: Negative for agitation, dysphoric mood and suicidal ideas. The patient is not nervous/anxious.     Per HPI unless specifically indicated above     Objective:    BP 110/74 (BP Location: Left Arm, Patient Position: Sitting, Cuff Size: Normal)   Pulse (!) 57   Temp 97.7 F (36.5 C) (Other (Comment))   Ht 5\' 5"  (1.651 m)   Wt 171 lb (77.6 kg)   LMP 07/26/2017 (Exact Date)   SpO2 98%   BMI 28.46 kg/m   Wt Readings from Last 3 Encounters:  08/06/17 171 lb (77.6 kg)  01/27/17 170 lb 8 oz (77.3 kg)  12/30/16 170 lb (77.1 kg)    Physical Exam  Constitutional: She is oriented to person, place, and time. She appears well-developed and well-nourished.  HENT:  Head: Normocephalic and atraumatic.  Neck: Neck supple.  Cardiovascular: Normal rate and regular rhythm.   Pulmonary/Chest: Effort normal and breath sounds normal.  Abdominal: Soft. Bowel sounds are normal. She exhibits no mass. There is no hepatosplenomegaly. There is no tenderness.  Musculoskeletal: She exhibits no edema.  Lymphadenopathy:    She has no cervical adenopathy.  Neurological: She is alert and oriented to person, place, and time.  Skin: Skin is warm and dry.  Psychiatric: She has a normal mood and affect. Her behavior is normal.  Vitals reviewed.  Assessment & Plan:   Encounter Diagnoses  Name Primary?  . Factor 5 Leiden mutation, heterozygous (HCC) Yes  . Current use of long term anticoagulation   . Screening for breast cancer      -Check cbc today -Renew rx- (assistance program good thru feb)  -pt counseled to avoid running out of her blood thinner -Since pt turns 46 this weekend, gave iFOBT for colon cancer screening -screening  mammogram  -pt to follow up in 6 months.  RTO sooner prn

## 2017-08-11 ENCOUNTER — Other Ambulatory Visit: Payer: Self-pay | Admitting: Physician Assistant

## 2017-08-11 DIAGNOSIS — Z1211 Encounter for screening for malignant neoplasm of colon: Secondary | ICD-10-CM

## 2017-08-11 LAB — IFOBT (OCCULT BLOOD): IFOBT: NEGATIVE

## 2017-09-03 ENCOUNTER — Other Ambulatory Visit: Payer: Self-pay | Admitting: Physician Assistant

## 2017-09-03 DIAGNOSIS — Z1231 Encounter for screening mammogram for malignant neoplasm of breast: Secondary | ICD-10-CM

## 2017-11-19 ENCOUNTER — Other Ambulatory Visit: Payer: Self-pay | Admitting: Physician Assistant

## 2017-11-19 MED ORDER — RIVAROXABAN 20 MG PO TABS: ORAL_TABLET | ORAL | 2 refills | Status: DC

## 2017-11-30 ENCOUNTER — Encounter: Payer: Self-pay | Admitting: Physician Assistant

## 2017-11-30 ENCOUNTER — Ambulatory Visit: Payer: Self-pay | Admitting: Physician Assistant

## 2017-11-30 VITALS — BP 114/72 | HR 71 | Temp 97.7°F | Ht 65.0 in | Wt 175.5 lb

## 2017-11-30 DIAGNOSIS — J00 Acute nasopharyngitis [common cold]: Secondary | ICD-10-CM

## 2017-11-30 DIAGNOSIS — Z7901 Long term (current) use of anticoagulants: Secondary | ICD-10-CM

## 2017-11-30 MED ORDER — GUAIFENESIN ER 600 MG PO TB12: ORAL_TABLET | ORAL | 3 refills | Status: DC

## 2017-11-30 NOTE — Patient Instructions (Signed)
Infección de las vías aéreas superiores en los adultos.  Upper Respiratory Infection, Adult  La mayoría de las infecciones del tracto respiratorio superior son infecciones virales de las vías que llevan el aire a los pulmones. Un infección del tracto respiratorio superior afecta la nariz, la garganta y las vías respiratorias superiores. El tipo más frecuente de infección del tracto respiratorio superior es la nasofaringitis, que habitualmente se conoce como "resfrío común".  Las infecciones del tracto respiratorio superior siguen su curso y por lo general se curan solas. En la mayoría de los casos, la infección del tracto respiratorio superior no requiere atención médica, pero a veces, después de una infección viral, puede surgir una infección bacteriana en las vías respiratorias superiores. Esto se conoce como infección secundaria. Las infecciones sinusales y en el oído medio son tipos frecuentes de infecciones secundarias en el tracto respiratorio superior.  La neumonía bacteriana también puede complicar una infección del tracto respiratorio superior. Este tipo de infección puede empeorar el asma y la enfermedad pulmonar obstructiva crónica (EPOC). En algunos casos, estas complicaciones pueden requerir atención médica de emergencia y poner en peligro la vida.  ¿Cuáles son las causas?  Casi todas las infecciones del tracto respiratorio superior se deben a los virus. Un virus es un tipo de germen que puede contagiarse de una persona a otra.  ¿Qué incrementa el riesgo?  Puede estar en riesgo de sufrir una infección del tracto respiratorio superior si:  · Fuma.  · Tiene una enfermedad pulmonar o cardíaca crónica.  · Tiene debilitado el sistema de defensa (inmunitario) del cuerpo.  · Es muy joven o de edad muy avanzada.  · Tiene asma o alergias nasales.  · Trabaja en áreas donde hay mucha gente o poca ventilación.  · Trabaja en una escuela o en un centro de atención médica.    ¿Cuáles son los signos o los  síntomas?  Habitualmente, los síntomas aparecen de 2 a 3 días después de entrar en contacto con el virus del resfrío. La mayoría de las infecciones virales en el tracto respiratorio superior duran de 7 a 10 días. Sin embargo, las infecciones virales en el tracto respiratorio superior a causa del virus de la gripe pueden durar de 14 a 18 días y, habitualmente, son más graves. Entre los síntomas se pueden incluir los siguientes:  · Secreción o congestión nasal.  · Estornudos.  · Tos.  · Dolor de garganta.  · Dolor de cabeza.  · Fatiga.  · Fiebre.  · Pérdida del apetito.  · Dolor en la frente, detrás de los ojos y por encima de los pómulos (dolor sinusal).  · Dolores musculares.    ¿Cómo se diagnostica?  El médico puede diagnosticar una infección del tracto respiratorio superior mediante los siguientes estudios:  · Examen físico.  · Pruebas para verificar si los síntomas no se deben a otra afección, por ejemplo:  ? Faringitis estreptocócica.  ? Sinusitis.  ? Neumonía.  ? Asma.    ¿Cómo se trata?  Esta infección desaparece sola con el tiempo. No puede curarse con medicamentos, pero a menudo se prescriben para aliviar los síntomas. Los medicamentos pueden ser útiles para lo siguiente:  · Reducción de la fiebre.  · Reducción de la tos.  · Alivio de la congestión nasal.    Siga estas instrucciones en su casa:  · Tome los medicamentos solamente como se lo haya indicado el médico.  · A fin de aliviar el dolor de garganta, haga gárgaras con solución salina templada o consuma caramelos para la tos según   lo que le haya indicado el médico.  · Use un humidificador con vapor cálido o inhale el vapor de la ducha para aumentar la humedad del aire. Esto facilitará la respiración.  · Beba suficiente líquido para mantener la orina clara o de color amarillo pálido.  · Consuma sopas y otros caldos transparentes, y aliméntese bien.  · Descanse todo lo que sea necesario.  · Regrese al trabajo cuando la temperatura se le haya normalizado o  según lo que le indique el médico. Es posible que deba quedarse en su casa durante un tiempo prolongado, para no infectar a los demás. También puede usar un barbijo y lavarse las manos con cuidado para evitar la propagación del virus.  · Aumente el uso del inhalador si tiene asma.  · No consuma ningún producto que contenga tabaco, lo que incluye cigarrillos, tabaco de mascar o cigarrillos electrónicos. Si necesita ayuda para dejar de fumar, consulte al médico.  ¿Cómo se evita?  La mejor manera de protegerse de un resfrío es con la práctica de una higiene adecuada.  · Evite el contacto por vía oral o a través de las manos con personas que tengan síntomas de resfrío.  · En caso de contacto, lávese las manos con frecuencia.    No hay pruebas claras de que la vitamina C, la vitamina E, la equinácea o el ejercicio reduzcan la probabilidad de contraer un resfrío. Sin embargo, siempre se recomienda descansar mucho, hacer ejercicio y alimentarse bien.  Comuníquese con un médico si:  · Su estado empeora en lugar de mejorar.  · Los medicamentos no logran controlar los síntomas.  · Tiene escalofríos.  · Experimenta un empeoramiento en la falta de aire.  · Tiene mucosidad marrón o roja.  · Tiene secreción nasal amarilla o marrón.  · Le duele la cara, especialmente al inclinarse hacia adelante.  · Tiene fiebre.  · Tiene los ganglios del cuello hinchados.  · Siente dolor al tragar.  · Tiene zonas blancas en la parte de atrás de la garganta.  Solicite ayuda de inmediato si:  · Tiene síntomas intensos o persistentes de:  ? Dolor de cabeza.  ? Dolor de oído.  ? Dolor sinusal.  ? Dolor en el pecho.  · Tiene enfermedad pulmonar crónica y cualquiera de estos síntomas:  ? Sibilancias.  ? Tos prolongada.  ? Tos con sangre.  ? Cambio en la mucosidad habitual.  · Presenta rigidez en el cuello.  · Tiene cambios en:  ? La visión.  ? La audición.  ? El pensamiento.  ? El estado de ánimo.  Esta información no tiene como fin reemplazar el  consejo del médico. Asegúrese de hacerle al médico cualquier pregunta que tenga.  Document Released: 08/20/2005 Document Revised: 02/25/2017 Document Reviewed: 02/15/2014  Elsevier Interactive Patient Education © 2018 [employer]

## 2017-11-30 NOTE — Progress Notes (Signed)
BP 114/72 (BP Location: Left Arm, Patient Position: Sitting, Cuff Size: Normal)   Pulse 71   Temp 97.7 F (36.5 C) (Other (Comment))   Ht 5\' 5"  (1.651 m)   Wt 175 lb 8 oz (79.6 kg)   LMP 11/18/2017 (Exact Date)   SpO2 97%   BMI 29.20 kg/m    Subjective:    Patient ID: [name], female    DOB: 29-Feb-1971, 47 y.o.   MRN: [id number]  HPI: Tonya HaggardDilcia H Roberts is a 47 y.o. female presenting on 11/30/2017 for Nasal Congestion   HPI   Pt started feeling bad last Tuesday with nasal congestion. No cough.  She hast ST, EA and HA.   subjective fever at night.  She is using nyquil and theraflu.   Relevant past medical, surgical, family and social history reviewed and updated as indicated. Interim medical history since our last visit reviewed. Allergies and medications reviewed and updated.   Current Outpatient Medications:  .  rivaroxaban (XARELTO) 20 MG TABS tablet, 1 po qd with supper.  Tome una tableta por boca diaria con la cena, Disp: 30 tablet, Rfl: 2   Review of Systems  Constitutional: Positive for fever. Negative for appetite change, chills, diaphoresis, fatigue and unexpected weight change.  HENT: Positive for ear pain, sneezing and sore throat. Negative for congestion, dental problem, drooling, facial swelling, hearing loss, mouth sores, trouble swallowing and voice change.   Eyes: Negative for pain, discharge, redness, itching and visual disturbance.  Respiratory: Negative for cough, choking, shortness of breath and wheezing.   Cardiovascular: Negative for chest pain, palpitations and leg swelling.  Gastrointestinal: Negative for abdominal pain, blood in stool, constipation, diarrhea and vomiting.  Endocrine: Negative for cold intolerance, heat intolerance and polydipsia.  Genitourinary: Negative for decreased urine volume, dysuria and hematuria.  Musculoskeletal: Negative for arthralgias, back pain and gait problem.  Skin: Negative for rash.  Allergic/Immunologic: Negative  for environmental allergies.  Neurological: Positive for headaches. Negative for seizures, syncope and light-headedness.  Hematological: Negative for adenopathy.  Psychiatric/Behavioral: Negative for agitation, dysphoric mood and suicidal ideas. The patient is not nervous/anxious.     Per HPI unless specifically indicated above     Objective:    BP 114/72 (BP Location: Left Arm, Patient Position: Sitting, Cuff Size: Normal)   Pulse 71   Temp 97.7 F (36.5 C) (Other (Comment))   Ht 5\' 5"  (1.651 m)   Wt 175 lb 8 oz (79.6 kg)   LMP 11/18/2017 (Exact Date)   SpO2 97%   BMI 29.20 kg/m   Wt Readings from Last 3 Encounters:  11/30/17 175 lb 8 oz (79.6 kg)  08/06/17 171 lb (77.6 kg)  01/27/17 170 lb 8 oz (77.3 kg)    Physical Exam  Constitutional: She is oriented to person, place, and time. She appears well-developed and well-nourished.  HENT:  Head: Normocephalic and atraumatic.  Right Ear: Hearing, tympanic membrane, external ear and ear canal normal.  Left Ear: Hearing, tympanic membrane, external ear and ear canal normal.  Nose: Nose normal.  Mouth/Throat: Uvula is midline and oropharynx is clear and moist. No oropharyngeal exudate.  Neck: Neck supple.  Cardiovascular: Normal rate and regular rhythm.  Pulmonary/Chest: Effort normal and breath sounds normal. She has no wheezes.  Abdominal: Soft. Bowel sounds are normal. She exhibits no mass. There is no hepatosplenomegaly. There is no tenderness.  Musculoskeletal: She exhibits no edema.  Lymphadenopathy:    She has no cervical adenopathy.  Neurological: She is alert  and oriented to person, place, and time.  Skin: Skin is warm and dry.  Psychiatric: She has a normal mood and affect. Her behavior is normal.  Vitals reviewed.       Assessment & Plan:   Encounter Diagnoses  Name Primary?  . Acute nasopharyngitis Yes  . Current use of long term anticoagulation      -Counseled pt on rest, fluids, symptomatic  treatment -rx guaifenesin -pt to follow up as scheduled.  RTO sooner prn

## 2018-02-09 ENCOUNTER — Ambulatory Visit: Payer: Self-pay | Admitting: Physician Assistant

## 2018-02-15 ENCOUNTER — Encounter: Payer: Self-pay | Admitting: Physician Assistant

## 2018-02-23 ENCOUNTER — Ambulatory Visit: Payer: Self-pay | Admitting: Physician Assistant

## 2018-02-23 ENCOUNTER — Encounter: Payer: Self-pay | Admitting: Physician Assistant

## 2018-02-23 VITALS — BP 118/73 | HR 73 | Temp 97.7°F | Ht 65.0 in | Wt 173.8 lb

## 2018-02-23 DIAGNOSIS — Z7901 Long term (current) use of anticoagulants: Secondary | ICD-10-CM

## 2018-02-23 DIAGNOSIS — D6851 Activated protein C resistance: Secondary | ICD-10-CM

## 2018-02-23 DIAGNOSIS — Z1239 Encounter for other screening for malignant neoplasm of breast: Secondary | ICD-10-CM

## 2018-02-23 MED ORDER — RIVAROXABAN 20 MG PO TABS: ORAL_TABLET | ORAL | 2 refills | Status: DC

## 2018-02-23 NOTE — Progress Notes (Signed)
BP 118/73 (BP Location: Right Arm, Patient Position: Sitting, Cuff Size: Normal)   Pulse 73   Temp 97.7 F (36.5 C)   Ht 5\' 5"  (1.651 m)   Wt 173 lb 12 oz (78.8 kg)   SpO2 99%   BMI 28.91 kg/m    Subjective:    Patient ID: Denna Haggardilcia H Kettles, female    DOB: 1971/10/05, 47 y.o.   MRN: [id number]  HPI: Denna HaggardDilcia H Borror is a [age] y.o. female presenting on 02/23/2018 for Follow-up (pt states she ran out of xarelto around March 20.)   HPI   Chief Complaint  Patient presents with  . Follow-up    pt states she ran out of xarelto around March 20.   Pt is working [employer].   Pt says someone called about a mammogram but they didn't speak spanish   She is feeling well and has no complaints.   Relevant past medical, surgical, family and social history reviewed and updated as indicated. Interim medical history since our last visit reviewed. Allergies and medications reviewed and updated.   Current Outpatient Medications:  .  rivaroxaban (XARELTO) 20 MG TABS tablet, 1 po qd with supper.  Tome una tableta por boca diaria con la cena (Patient not taking: Reported on 02/23/2018), Disp: 30 tablet, Rfl: 2   Review of Systems  Constitutional: Negative for appetite change, chills, diaphoresis, fatigue, fever and unexpected weight change.  HENT: Negative for congestion, dental problem, drooling, ear pain, facial swelling, hearing loss, mouth sores, sneezing, sore throat, trouble swallowing and voice change.   Eyes: Negative for pain, discharge, redness, itching and visual disturbance.  Respiratory: Negative for cough, choking, shortness of breath and wheezing.   Cardiovascular: Negative for chest pain, palpitations and leg swelling.  Gastrointestinal: Negative for abdominal pain, blood in stool, constipation, diarrhea and vomiting.  Endocrine: Negative for cold intolerance, heat intolerance and polydipsia.  Genitourinary: Negative for decreased urine volume, dysuria and hematuria.   Musculoskeletal: Negative for arthralgias, back pain and gait problem.  Skin: Negative for rash.  Allergic/Immunologic: Negative for environmental allergies.  Neurological: Negative for seizures, syncope, light-headedness and headaches.  Hematological: Negative for adenopathy.  Psychiatric/Behavioral: Negative for agitation, dysphoric mood and suicidal ideas. The patient is not nervous/anxious.     Per HPI unless specifically indicated above     Objective:    BP 118/73 (BP Location: Right Arm, Patient Position: Sitting, Cuff Size: Normal)   Pulse 73   Temp 97.7 F (36.5 C)   Ht 5\' 5"  (1.651 m)   Wt 173 lb 12 oz (78.8 kg)   SpO2 99%   BMI 28.91 kg/m   Wt Readings from Last 3 Encounters:  02/23/18 173 lb 12 oz (78.8 kg)  11/30/17 175 lb 8 oz (79.6 kg)  08/06/17 171 lb (77.6 kg)    Physical Exam  Constitutional: She is oriented to person, place, and time. She appears well-developed and well-nourished.  HENT:  Head: Normocephalic and atraumatic.  Neck: Neck supple.  Cardiovascular: Normal rate and regular rhythm.  Pulmonary/Chest: Effort normal and breath sounds normal.  Abdominal: Soft. Bowel sounds are normal. She exhibits no mass. There is no hepatosplenomegaly. There is no tenderness.  Musculoskeletal: She exhibits no edema.  Lymphadenopathy:    She has no cervical adenopathy.  Neurological: She is alert and oriented to person, place, and time.  Skin: Skin is warm and dry.  Psychiatric: She has a normal mood and affect. Her behavior is normal.  Vitals reviewed.  Assessment & Plan:   Encounter Diagnoses  Name Primary?  . Current use of long term anticoagulation Yes  . Factor 5 Leiden mutation, heterozygous (HCC)   . Screening for breast cancer     -counseled pt to avoid running out of her xarelto -gave rx to get back on her med until her free supply can arrive in the mail -will reorder screening mammogram -pt to follow up 11 months (before her xarelto  runs out).  Pt to contact office sooner prn.  Pt reminded to call office if she is having problems with her medication

## 2018-02-25 ENCOUNTER — Other Ambulatory Visit: Payer: Self-pay | Admitting: Physician Assistant

## 2018-02-25 DIAGNOSIS — Z1231 Encounter for screening mammogram for malignant neoplasm of breast: Secondary | ICD-10-CM

## 2018-03-11 ENCOUNTER — Other Ambulatory Visit: Payer: Self-pay | Admitting: Physician Assistant

## 2018-03-11 MED ORDER — RIVAROXABAN 20 MG PO TABS: ORAL_TABLET | ORAL | 11 refills | Status: DC

## 2018-04-29 ENCOUNTER — Ambulatory Visit
Admission: RE | Admit: 2018-04-29 | Discharge: 2018-04-29 | Disposition: A | Discharge status: Home / Self Care | Payer: No Typology Code available for payment source | Source: Ambulatory Visit | Attending: Physician Assistant | Admitting: Physician Assistant

## 2018-04-29 DIAGNOSIS — Z1231 Encounter for screening mammogram for malignant neoplasm of breast: Secondary | ICD-10-CM

## 2019-01-24 ENCOUNTER — Encounter: Payer: Self-pay | Admitting: Physician Assistant

## 2019-01-24 ENCOUNTER — Other Ambulatory Visit: Payer: Self-pay

## 2019-01-24 ENCOUNTER — Other Ambulatory Visit (HOSPITAL_COMMUNITY)
Admission: RE | Admit: 2019-01-24 | Discharge: 2019-01-24 | Disposition: A | Discharge status: Home / Self Care | Payer: No Typology Code available for payment source | Source: Ambulatory Visit | Attending: Physician Assistant | Admitting: Physician Assistant

## 2019-01-24 ENCOUNTER — Ambulatory Visit: Payer: Self-pay | Admitting: Physician Assistant

## 2019-01-24 ENCOUNTER — Other Ambulatory Visit: Payer: Self-pay | Admitting: Physician Assistant

## 2019-01-24 VITALS — BP 124/77 | HR 75 | Temp 98.1°F | Ht 64.5 in | Wt 173.5 lb

## 2019-01-24 DIAGNOSIS — D6851 Activated protein C resistance: Secondary | ICD-10-CM

## 2019-01-24 DIAGNOSIS — Z7901 Long term (current) use of anticoagulants: Secondary | ICD-10-CM

## 2019-01-24 LAB — CBC WITH DIFFERENTIAL/PLATELET
Abs Immature Granulocytes: 0.01 10*3/uL (ref 0.00–0.07)
Basophils Absolute: 0 10*3/uL (ref 0.0–0.1)
Basophils Relative: 1 %
Eosinophils Absolute: 0.1 10*3/uL (ref 0.0–0.5)
Eosinophils Relative: 2 %
HCT: 38.4 % (ref 36.0–46.0)
HEMOGLOBIN: 12.4 g/dL (ref 12.0–15.0)
IMMATURE GRANULOCYTES: 0 %
Lymphocytes Relative: 30 %
Lymphs Abs: 1.9 10*3/uL (ref 0.7–4.0)
MCH: 30.5 pg (ref 26.0–34.0)
MCHC: 32.3 g/dL (ref 30.0–36.0)
MCV: 94.3 fL (ref 80.0–100.0)
MONO ABS: 0.6 10*3/uL (ref 0.1–1.0)
Monocytes Relative: 9 %
Neutro Abs: 3.9 10*3/uL (ref 1.7–7.7)
Neutrophils Relative %: 58 %
PLATELETS: 307 10*3/uL (ref 150–400)
RBC: 4.07 MIL/uL (ref 3.87–5.11)
RDW: 12.6 % (ref 11.5–15.5)
WBC: 6.6 10*3/uL (ref 4.0–10.5)
nRBC: 0 % (ref 0.0–0.2)

## 2019-01-24 LAB — PROTIME-INR
INR: 1 (ref 0.8–1.2)
Prothrombin Time: 13.4 seconds (ref 11.4–15.2)

## 2019-01-24 MED ORDER — RIVAROXABAN 20 MG PO TABS: ORAL_TABLET | ORAL | 11 refills | Status: DC

## 2019-01-24 NOTE — Progress Notes (Signed)
BP 124/77 (BP Location: Right Arm, Patient Position: Sitting, Cuff Size: Normal)   Pulse 75   Temp 98.1 F (36.7 C)   Ht 5' 4.5" (1.638 m)   Wt 173 lb 8 oz (78.7 kg)   SpO2 100%   BMI 29.32 kg/m    Subjective:    Patient ID: [name], female    DOB: Jun 13, 1971, 48 y.o.   MRN: [id number]  HPI: [name] is a 48 y.o. 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    Relevant past medical, surgical, family and social history reviewed and updated as indicated. Interim medical history since our last visit reviewed. Allergies and medications reviewed and updated.   Current Outpatient Medications:  .  rivaroxaban (XARELTO) 20 MG TABS tablet, 1 po qd with supper.  Tome una tableta por boca diaria con la cena, Disp: 30 tablet, Rfl: 11    Review of Systems  Constitutional: Negative for appetite change, chills, diaphoresis, fatigue, fever and unexpected weight change.  HENT: Negative for congestion, dental problem, drooling, ear pain, facial swelling, hearing loss, mouth sores, sneezing, sore throat, trouble swallowing and voice change.   Eyes: Negative for pain, discharge, redness, itching and visual disturbance.  Respiratory: Negative for cough, choking, shortness of breath and wheezing.   Cardiovascular: Negative for chest pain, palpitations and leg swelling.  Gastrointestinal: Negative for abdominal pain, blood in stool, constipation, diarrhea and vomiting.  Endocrine: Negative for cold intolerance, heat intolerance and polydipsia.  Genitourinary: Negative for decreased urine volume, dysuria and hematuria.  Musculoskeletal: Positive for back pain. Negative for arthralgias and gait problem.  Skin: Negative for rash.  Allergic/Immunologic: Negative for environmental allergies.   Neurological: Positive for headaches. Negative for seizures, syncope and light-headedness.  Hematological: Negative for adenopathy.  Psychiatric/Behavioral: Negative for agitation, dysphoric mood and suicidal ideas. The patient is not nervous/anxious.     Per HPI unless specifically indicated above     Objective:    BP 124/77 (BP Location: Right Arm, Patient Position: Sitting, Cuff Size: Normal)   Pulse 75   Temp 98.1 F (36.7 C)   Ht 5' 4.5" (1.638 m)   Wt 173 lb 8 oz (78.7 kg)   SpO2 100%   BMI 29.32 kg/m   Wt Readings from Last 3 Encounters:  01/24/19 173 lb 8 oz (78.7 kg)  02/23/18 173 lb 12 oz (78.8 kg)  11/30/17 175 lb 8 oz (79.6 kg)    Physical Exam Vitals signs reviewed.  Constitutional:      Appearance: She is well-developed.  HENT:     Head: Normocephalic and atraumatic.  Neck:     Musculoskeletal: Neck supple.  Cardiovascular:     Rate and Rhythm: Normal rate and regular rhythm.  Pulmonary:     Effort: Pulmonary effort is normal.     Breath sounds: Normal breath sounds.  Abdominal:     General: Bowel sounds are normal.     Palpations: Abdomen is soft. There is no mass.     Tenderness: There is no abdominal tenderness.  Lymphadenopathy:     Cervical: No cervical adenopathy.  Skin:    General: Skin is warm and dry.  Neurological:     Mental Status: She is alert and oriented to person, place, and time.  Psychiatric:  Behavior: Behavior normal.         Assessment & Plan:   Encounter Diagnoses  Name Primary?  . Current use of long term anticoagulation Yes  . Factor 5 Leiden mutation, heterozygous (HCC)     -Renew pt assistance for xarelto -Will check labs and call pt with results -pt to follow up Mid May to update PAP.  RTO sooner prn

## 2019-02-21 ENCOUNTER — Other Ambulatory Visit: Payer: Self-pay | Admitting: Physician Assistant

## 2019-02-21 MED ORDER — RIVAROXABAN 20 MG PO TABS: ORAL_TABLET | ORAL | 11 refills | Status: DC

## 2019-02-24 ENCOUNTER — Ambulatory Visit: Payer: Self-pay | Admitting: Physician Assistant

## 2019-04-05 ENCOUNTER — Ambulatory Visit: Payer: Self-pay | Admitting: Physician Assistant

## 2019-06-27 ENCOUNTER — Telehealth: Payer: Self-pay | Admitting: Student

## 2019-06-27 ENCOUNTER — Encounter: Payer: Self-pay | Admitting: Physician Assistant

## 2019-06-27 ENCOUNTER — Encounter (HOSPITAL_COMMUNITY): Payer: Self-pay | Admitting: Emergency Medicine

## 2019-06-27 ENCOUNTER — Ambulatory Visit: Payer: Self-pay | Admitting: Physician Assistant

## 2019-06-27 ENCOUNTER — Other Ambulatory Visit: Payer: Self-pay

## 2019-06-27 ENCOUNTER — Emergency Department (HOSPITAL_COMMUNITY)
Admission: EM | Admit: 2019-06-27 | Discharge: 2019-06-27 | Disposition: A | Discharge status: Home / Self Care | Payer: Self-pay | Attending: Emergency Medicine | Admitting: Emergency Medicine

## 2019-06-27 ENCOUNTER — Ambulatory Visit (HOSPITAL_COMMUNITY)
Admission: RE | Admit: 2019-06-27 | Discharge: 2019-06-27 | Disposition: A | Discharge status: Home / Self Care | Payer: Self-pay | Source: Ambulatory Visit | Attending: Physician Assistant | Admitting: Physician Assistant

## 2019-06-27 ENCOUNTER — Other Ambulatory Visit (HOSPITAL_COMMUNITY)
Admission: RE | Admit: 2019-06-27 | Discharge: 2019-06-27 | Disposition: A | Discharge status: Home / Self Care | Payer: Self-pay | Source: Ambulatory Visit | Attending: Physician Assistant | Admitting: Physician Assistant

## 2019-06-27 ENCOUNTER — Other Ambulatory Visit (HOSPITAL_COMMUNITY)
Admission: RE | Admit: 2019-06-27 | Payer: No Typology Code available for payment source | Source: Ambulatory Visit | Admitting: *Deleted

## 2019-06-27 VITALS — BP 106/70 | HR 79 | Temp 98.1°F | Wt 187.0 lb

## 2019-06-27 DIAGNOSIS — Z789 Other specified health status: Secondary | ICD-10-CM

## 2019-06-27 DIAGNOSIS — Z7901 Long term (current) use of anticoagulants: Secondary | ICD-10-CM

## 2019-06-27 DIAGNOSIS — Z124 Encounter for screening for malignant neoplasm of cervix: Secondary | ICD-10-CM | POA: Insufficient documentation

## 2019-06-27 DIAGNOSIS — M7989 Other specified soft tissue disorders: Secondary | ICD-10-CM | POA: Insufficient documentation

## 2019-06-27 DIAGNOSIS — Z5321 Procedure and treatment not carried out due to patient leaving prior to being seen by health care provider: Secondary | ICD-10-CM | POA: Insufficient documentation

## 2019-06-27 DIAGNOSIS — D6851 Activated protein C resistance: Secondary | ICD-10-CM

## 2019-06-27 NOTE — ED Triage Notes (Signed)
Pt sent over by [facility] for a DVT in LLE. Pt had been c/o pain and swelling in RT calf x 2 days. [place] was performed and pt noted to have blood clot. Denies SOB.

## 2019-06-27 NOTE — ED Notes (Signed)
Phlebotomist stated they had called pt twice with no answer as well

## 2019-06-27 NOTE — Telephone Encounter (Signed)
LPN called and notified pt of positive blood clot shown on 06-27-19 [place]. LPN advised pt to go to ER for treatment. Pt verbalized understanding.

## 2019-06-27 NOTE — Progress Notes (Signed)
BP 106/70   Pulse 79   Temp 98.1 F (36.7 C)   Wt 187 lb (84.8 kg)   LMP 06/11/2019   SpO2 98%   BMI 31.60 kg/m    Subjective:    Patient ID: [name], female    DOB: May 18, 1971, 48 y.o.   MRN: [id number]  HPI: [name] is a 48 y.o. female presenting on 06/27/2019 for Gynecologic Exam and Leg Swelling (bilateral lower leg swelling. L>R for about 2 weeks. pt c/o pain on L calf when walking or flexing. pt states she has been taking her xarelto daily and has not ran out. )   HPI   Pt had a negative covid 19 screening questionnaire    Chief Complaint  Patient presents with  . Gynecologic Exam  . Leg Swelling    bilateral lower leg swelling. L>R for about 2 weeks. pt c/o pain on L calf when walking or flexing. pt states she has been taking her xarelto daily and has not ran out.     Pt denies SOB   Relevant past medical, surgical, family and social history reviewed and updated as indicated. Interim medical history since our last visit reviewed. Allergies and medications reviewed and updated.   Current Outpatient Medications:  .  rivaroxaban (XARELTO) 20 MG TABS tablet, 1 po qd with supper.  Tome una tableta por boca diaria con la cena, Disp: 30 tablet, Rfl: 11    Review of Systems  Per HPI unless specifically indicated above     Objective:    BP 106/70   Pulse 79   Temp 98.1 F (36.7 C)   Wt 187 lb (84.8 kg)   LMP 06/11/2019   SpO2 98%   BMI 31.60 kg/m   Wt Readings from Last 3 Encounters:  06/27/19 187 lb (84.8 kg)  01/24/19 173 lb 8 oz (78.7 kg)  02/23/18 173 lb 12 oz (78.8 kg)    Physical Exam Vitals signs and nursing note reviewed. Exam conducted with a chaperone present.  Constitutional:      General: She is not in acute distress.    Appearance: Normal appearance. She is well-developed. She is not ill-appearing.  HENT:     Head: Normocephalic and atraumatic.  Pulmonary:     Effort: Pulmonary effort is normal. No respiratory distress.   Abdominal:     Palpations: Abdomen is soft. There is no mass.     Tenderness: There is no abdominal tenderness. There is no guarding or rebound.  Genitourinary:    Labia:        Right: No rash, tenderness or lesion.        Left: No rash, tenderness or lesion.      Vagina: Normal.     Cervix: No cervical motion tenderness, discharge or friability.     Adnexa:        Right: No mass, tenderness or fullness.         Left: No mass, tenderness or fullness.       Comments: Nurse Manuella GhaziBerenice assisted Musculoskeletal:        General: Swelling present.     Right lower leg: No edema.     Left lower leg: She exhibits swelling. No edema.     Comments: + Homan's sign on the left, negative on the right.  L calf measures 16 inches, R 15 inches.    Skin:    General: Skin is warm and dry.  Neurological:     Mental Status:  She is alert and oriented to person, place, and time.  Psychiatric:        Attention and Perception: Attention normal.        Speech: Speech normal.        Behavior: Behavior is cooperative.              Encounter Diagnoses  Name Primary?  . Swelling of lower leg Yes  . Factor 5 Leiden mutation, heterozygous ([place])   . Current use of long term anticoagulation   . Routine cervical smear   . Non-English speaking patient        -pt will be sent for doppler today to evaluate for DVT -pt to continue her xarelto -pt to follow up in early March.  She is to contact office sooner for any problems

## 2019-06-27 NOTE — ED Notes (Signed)
Called with no answer 

## 2019-06-29 LAB — CYTOLOGY - PAP
Diagnosis: UNDETERMINED — AB
HPV: DETECTED — AB

## 2019-07-11 ENCOUNTER — Other Ambulatory Visit: Payer: Self-pay | Admitting: Physician Assistant

## 2019-07-11 DIAGNOSIS — R87618 Other abnormal cytological findings on specimens from cervix uteri: Secondary | ICD-10-CM

## 2019-07-11 DIAGNOSIS — R8789 Other abnormal findings in specimens from female genital organs: Secondary | ICD-10-CM

## 2019-07-13 ENCOUNTER — Telehealth: Payer: Self-pay | Admitting: Student

## 2019-07-13 NOTE — Telephone Encounter (Signed)
Pt called back and was notified of Dr. Brynda Greathouse recommendations about 1 year f/u. Pt verbalized understanding.

## 2019-07-13 NOTE — Telephone Encounter (Signed)
LPN called pt on 01-30-64 and left voicemail for patient to call back. Will call back at a later time.  LPN to notify pt that per note added onto 07-11-19 referral to gynocology added by Gloris Manchester on 07-12-19 states:  "Per Our Provider Dr. Elonda Husky; Recommendation is 1 year follow up(3.6% 5 year risk of CIN3)."

## 2019-09-08 ENCOUNTER — Ambulatory Visit (HOSPITAL_COMMUNITY)
Admission: RE | Admit: 2019-09-08 | Discharge: 2019-09-08 | Disposition: A | Discharge status: Home / Self Care | Payer: Self-pay | Source: Ambulatory Visit | Attending: Family Medicine | Admitting: Family Medicine

## 2019-09-08 ENCOUNTER — Other Ambulatory Visit (HOSPITAL_COMMUNITY): Payer: Self-pay | Admitting: Family Medicine

## 2019-09-08 ENCOUNTER — Other Ambulatory Visit: Payer: Self-pay

## 2019-09-08 DIAGNOSIS — A150 Tuberculosis of lung: Secondary | ICD-10-CM

## 2020-01-25 ENCOUNTER — Ambulatory Visit: Payer: Self-pay | Admitting: Physician Assistant

## 2020-02-01 ENCOUNTER — Ambulatory Visit: Payer: Self-pay | Admitting: Physician Assistant

## 2020-02-01 ENCOUNTER — Encounter: Payer: Self-pay | Admitting: Physician Assistant

## 2020-02-01 ENCOUNTER — Other Ambulatory Visit: Payer: Self-pay

## 2020-02-01 VITALS — BP 104/76 | HR 72 | Temp 97.9°F | Wt 181.2 lb

## 2020-02-01 DIAGNOSIS — D6851 Activated protein C resistance: Secondary | ICD-10-CM

## 2020-02-01 DIAGNOSIS — Z7901 Long term (current) use of anticoagulants: Secondary | ICD-10-CM

## 2020-02-01 DIAGNOSIS — Z789 Other specified health status: Secondary | ICD-10-CM

## 2020-02-01 DIAGNOSIS — Z1239 Encounter for other screening for malignant neoplasm of breast: Secondary | ICD-10-CM

## 2020-02-01 MED ORDER — RIVAROXABAN 20 MG PO TABS: ORAL_TABLET | ORAL | 3 refills | Status: DC

## 2020-02-01 NOTE — Progress Notes (Signed)
BP 104/76   Pulse 72   Temp 97.9 F (36.6 C)   Wt 181 lb 3.2 oz (82.2 kg)   SpO2 99%   BMI 35.39 kg/m    Subjective:    Patient ID: [name], female    DOB: 11-05-1971, 49 y.o.   MRN: [id number]  HPI: JIYA KISSINGER is a 49 y.o. female presenting on 02/01/2020 for Anticoagulation   HPI   Pt had a negative covid 19 screening questionnaire.   Pt is 47yoF with his PE due to factor 5 Leiden deficiency who is on anticoagulation She says she has had No abnormal bleeeding.    She is not working currently  She does not exercise  She is feeling well and has no complaints.   She Needs repeat pap in august     Relevant past medical, surgical, family and social history reviewed and updated as indicated. Interim medical history since our last visit reviewed. Allergies and medications reviewed and updated.   Current Outpatient Medications:  .  rivaroxaban (XARELTO) 20 MG TABS tablet, 1 po qd with supper.  Tome una tableta por boca diaria con la cena, Disp: 30 tablet, Rfl: 11    Review of Systems  Per HPI unless specifically indicated above     Objective:    BP 104/76   Pulse 72   Temp 97.9 F (36.6 C)   Wt 181 lb 3.2 oz (82.2 kg)   SpO2 99%   BMI 35.39 kg/m   Wt Readings from Last 3 Encounters:  02/01/20 181 lb 3.2 oz (82.2 kg)  06/27/19 186 lb (84.4 kg)  06/27/19 187 lb (84.8 kg)    Physical Exam Vitals reviewed.  Constitutional:      General: She is not in acute distress.    Appearance: Normal appearance. She is well-developed. She is not ill-appearing.  HENT:     Head: Normocephalic and atraumatic.  Cardiovascular:     Rate and Rhythm: Normal rate and regular rhythm.  Pulmonary:     Effort: Pulmonary effort is normal.     Breath sounds: Normal breath sounds.  Abdominal:     General: Bowel sounds are normal.     Palpations: Abdomen is soft. There is no mass.     Tenderness: There is no abdominal tenderness.  Musculoskeletal:     Cervical  back: Neck supple.     Right lower leg: No edema.     Left lower leg: No edema.  Lymphadenopathy:     Cervical: No cervical adenopathy.  Skin:    General: Skin is warm and dry.  Neurological:     Mental Status: She is alert and oriented to person, place, and time.  Psychiatric:        Attention and Perception: Attention normal.        Mood and Affect: Mood normal.        Speech: Speech normal.        Behavior: Behavior normal. Behavior is cooperative.            Assessment & Plan:   Encounter Diagnoses  Name Primary?  . Factor 5 Leiden mutation, heterozygous ([place]) Yes  . Current use of long term anticoagulation   . Encounter for screening for malignant neoplasm of breast, unspecified screening modality   . Non-English speaking patient      -pt to Continue xarelto -will refer for screening mammogram -encouraged pt to exercise regularly to improve overall health -f/u for PAP in august.  Pt to contact office sooner

## 2020-02-12 ENCOUNTER — Other Ambulatory Visit: Payer: Self-pay | Admitting: Student

## 2020-02-12 DIAGNOSIS — Z1239 Encounter for other screening for malignant neoplasm of breast: Secondary | ICD-10-CM

## 2020-02-24 ENCOUNTER — Ambulatory Visit (HOSPITAL_COMMUNITY)
Admission: RE | Admit: 2020-02-24 | Discharge: 2020-02-24 | Disposition: A | Discharge status: Home / Self Care | Payer: Self-pay | Source: Ambulatory Visit | Attending: Physician Assistant | Admitting: Physician Assistant

## 2020-02-24 ENCOUNTER — Other Ambulatory Visit: Payer: Self-pay

## 2020-02-24 DIAGNOSIS — Z1239 Encounter for other screening for malignant neoplasm of breast: Secondary | ICD-10-CM | POA: Insufficient documentation

## 2020-02-25 ENCOUNTER — Other Ambulatory Visit: Payer: Self-pay | Admitting: Physician Assistant

## 2020-07-09 ENCOUNTER — Other Ambulatory Visit: Payer: Self-pay | Admitting: Physician Assistant

## 2020-07-09 DIAGNOSIS — Z13220 Encounter for screening for lipoid disorders: Secondary | ICD-10-CM

## 2020-07-09 DIAGNOSIS — Z7901 Long term (current) use of anticoagulants: Secondary | ICD-10-CM

## 2020-07-09 DIAGNOSIS — D6851 Activated protein C resistance: Secondary | ICD-10-CM

## 2020-07-09 NOTE — Progress Notes (Unsigned)
cmp

## 2020-07-18 ENCOUNTER — Ambulatory Visit: Payer: Self-pay | Admitting: Physician Assistant

## 2020-09-08 ENCOUNTER — Other Ambulatory Visit: Payer: Self-pay | Admitting: Physician Assistant

## 2020-11-30 ENCOUNTER — Other Ambulatory Visit: Payer: Self-pay | Admitting: Physician Assistant

## 2022-03-21 IMAGING — MG DIGITAL SCREENING BILAT W/ TOMO W/ CAD
8 series · 8 of 24 positions shown · non-contrast
Comparison: Previous exam(s).

CLINICAL DATA: Screening.

EXAM:
DIGITAL SCREENING BILATERAL MAMMOGRAM WITH TOMO AND CAD

[L MLO synth-2D]
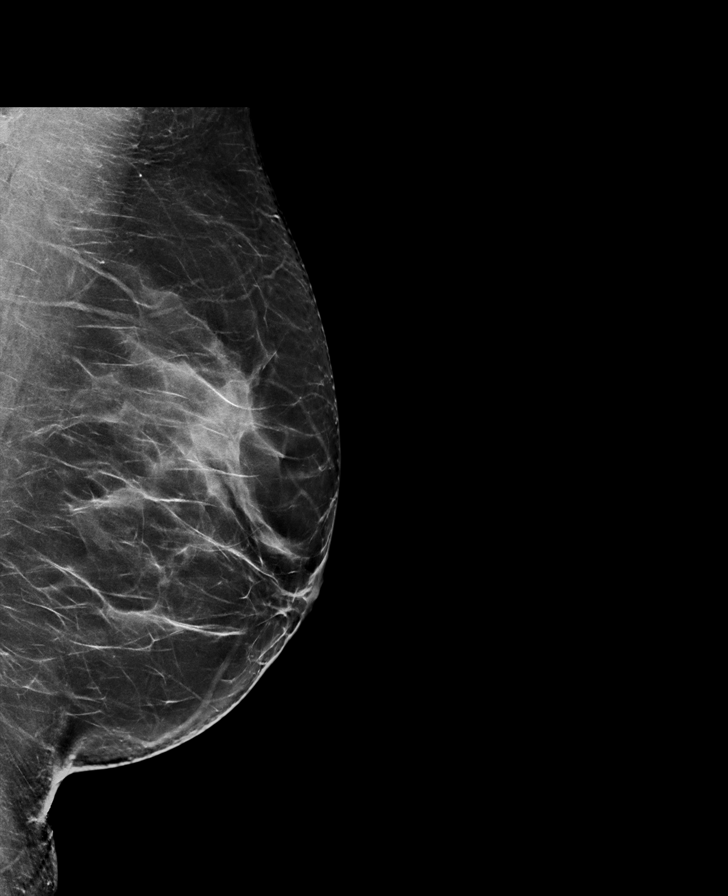

[R MLO synth-2D]
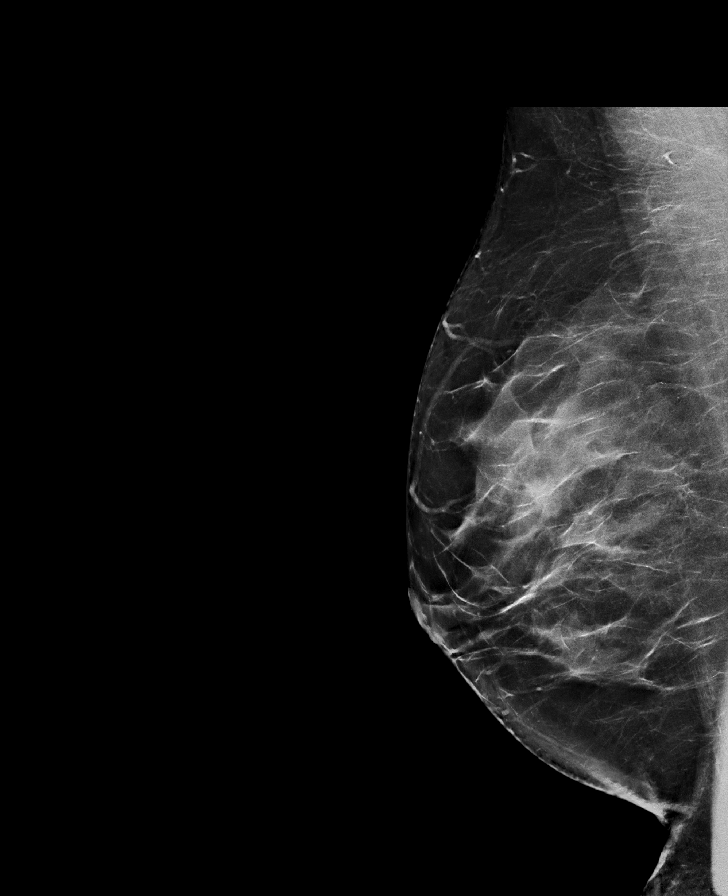

[R CC synth-2D]
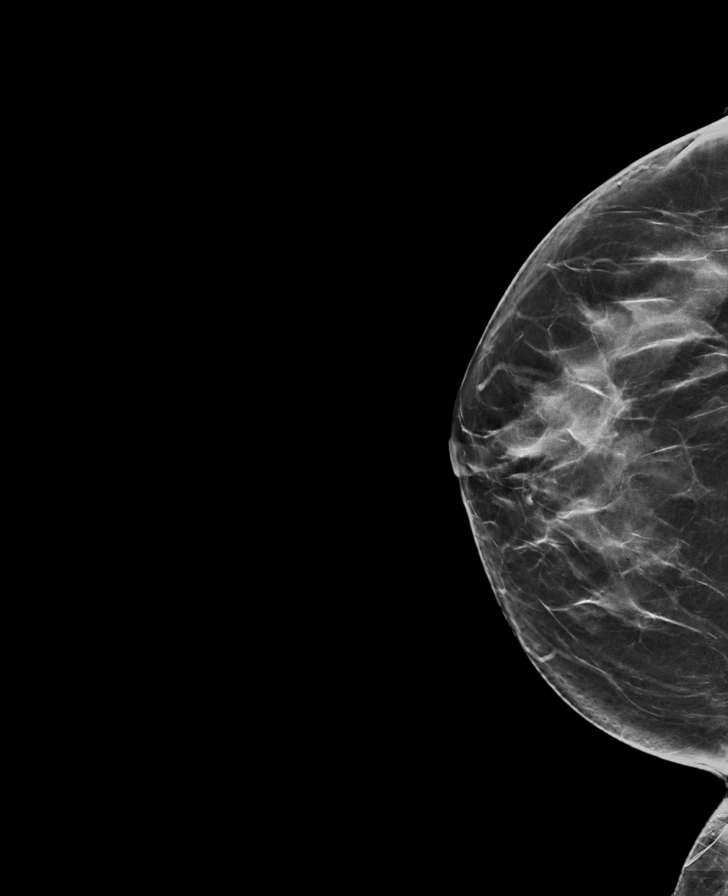

[L CC synth-2D]
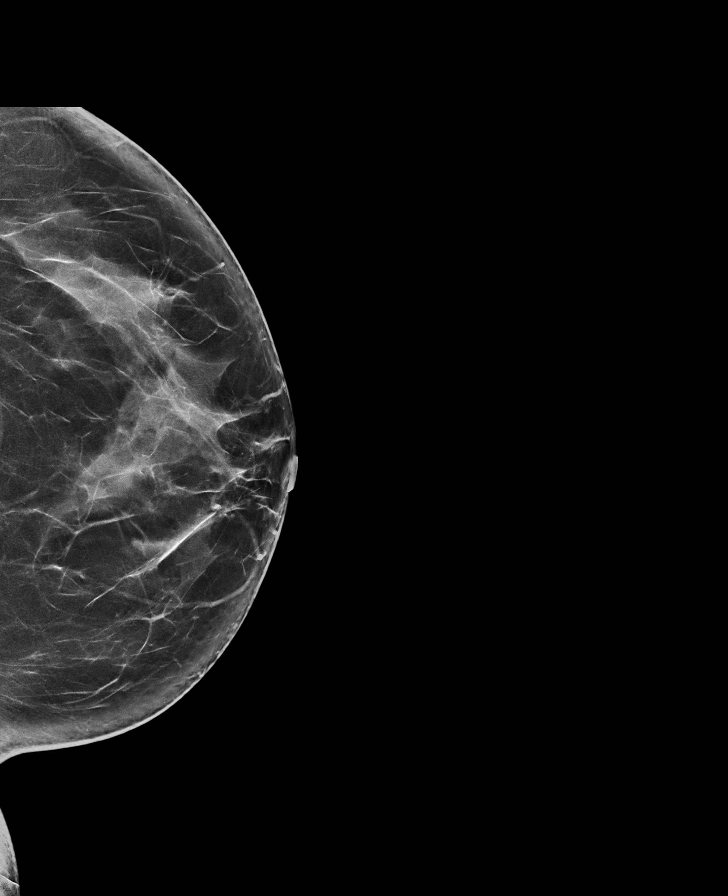

[L MLO tomo · tomo slice 41/81.0]
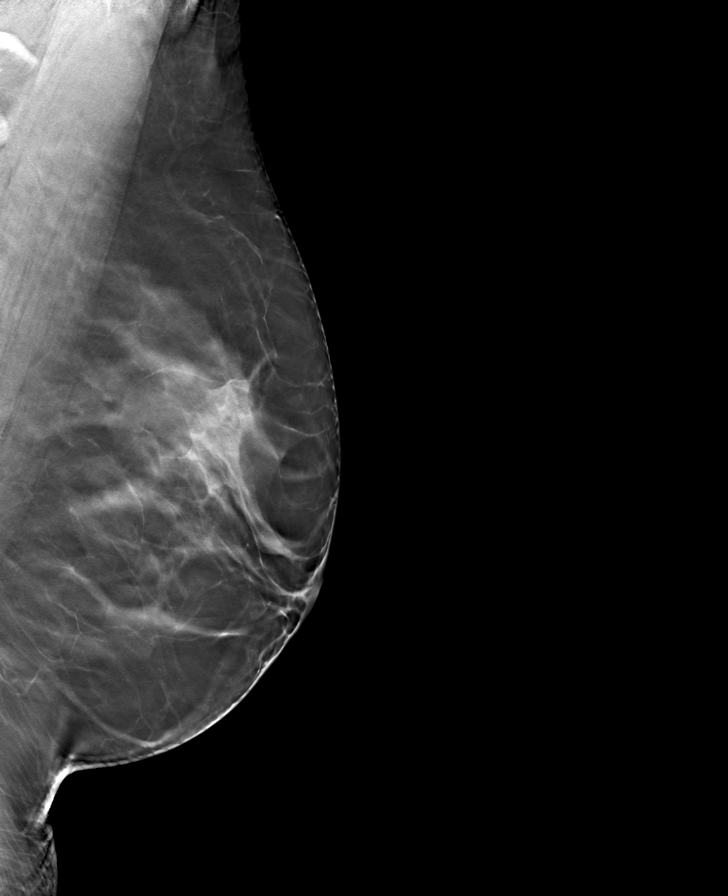

[L CC tomo · tomo slice 39/78.0]
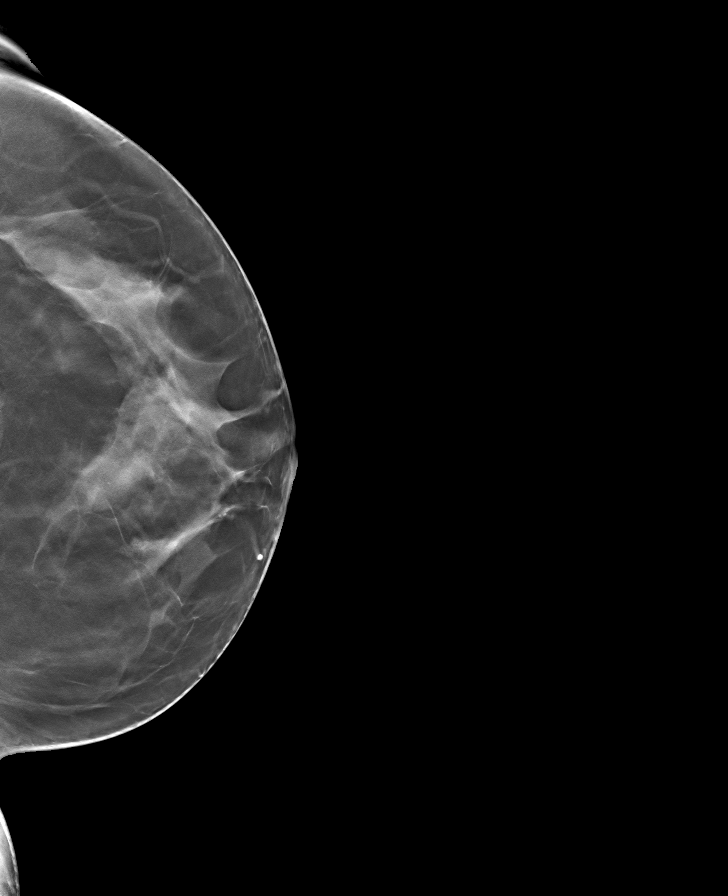

[R CC tomo · tomo slice 37/73.0]
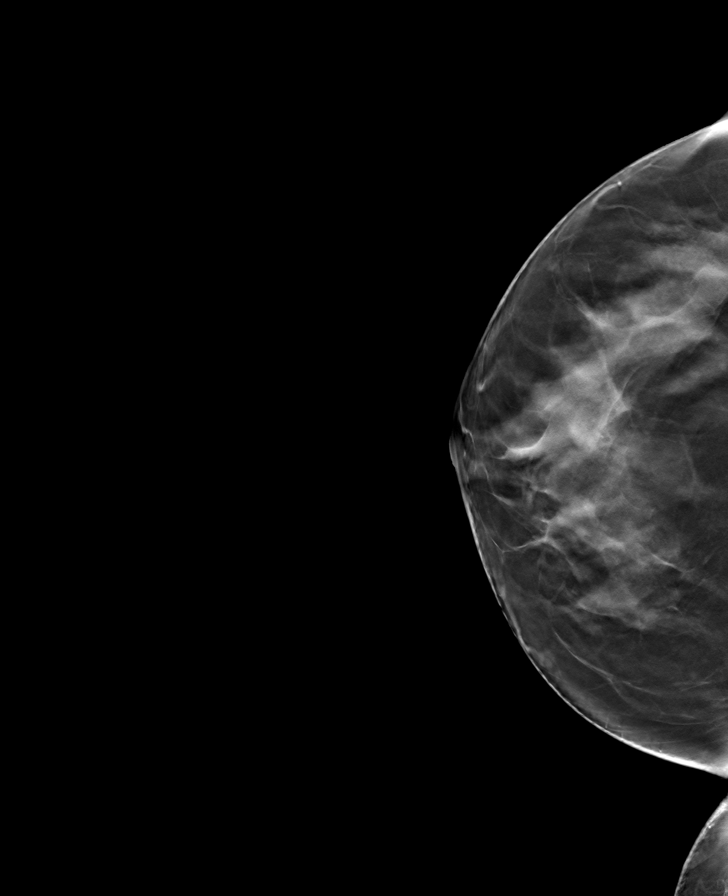

[R MLO tomo · tomo slice 41/81.0]
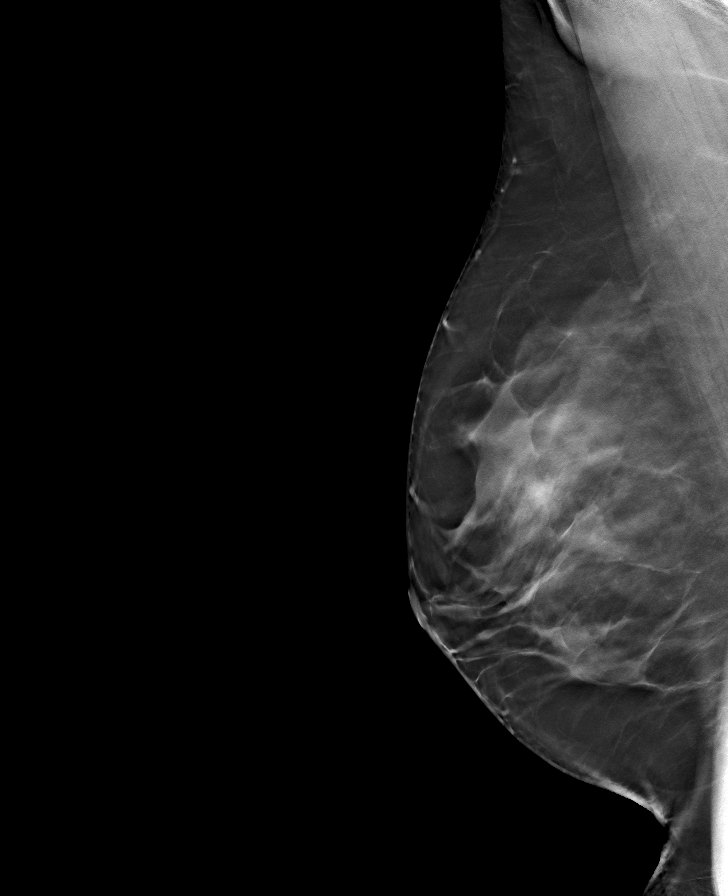

[8 of 24 positions shown; findings below may reference images not displayed]

ACR Breast Density Category c: The breast tissue is heterogeneously
dense, which may obscure small masses.
FINDINGS: There are no findings suspicious for malignancy. Images were
processed with CAD.
IMPRESSION: No mammographic evidence of malignancy. A result letter of this
screening mammogram will be mailed directly to the patient.

RECOMMENDATION:
Screening mammogram in one year. (Code:FT-U-LHB)

BI-RADS CATEGORY  1: Negative.
